# Patient Record
Sex: Female | Born: 1995 | Race: Black or African American | Hispanic: No | Marital: Single
Health system: Southern US, Community
[De-identification: ages and names within clinical notes are randomized; demographics above are authoritative.]

## PROBLEM LIST (undated history)

## (undated) ENCOUNTER — Ambulatory Visit (HOSPITAL_COMMUNITY): Admission: EM | Discharge status: Absent without leave | Payer: Managed Care, Other (non HMO)

## (undated) DIAGNOSIS — B359 Dermatophytosis, unspecified: Secondary | ICD-10-CM

## (undated) DIAGNOSIS — E663 Overweight: Secondary | ICD-10-CM

## (undated) DIAGNOSIS — E669 Obesity, unspecified: Secondary | ICD-10-CM

## (undated) HISTORY — DX: Overweight: E66.3

## (undated) HISTORY — DX: Obesity, unspecified: E66.9

## (undated) HISTORY — DX: Dermatophytosis, unspecified: B35.9

## (undated) HISTORY — PX: WISDOM TOOTH EXTRACTION: SHX21

---
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
## 2016-02-13 ENCOUNTER — Encounter: Payer: Self-pay | Admitting: Family

## 2016-02-13 ENCOUNTER — Ambulatory Visit (INDEPENDENT_AMBULATORY_CARE_PROVIDER_SITE_OTHER): Payer: Managed Care, Other (non HMO) | Admitting: Family

## 2016-02-13 ENCOUNTER — Ambulatory Visit
Admission: RE | Admit: 2016-02-13 | Discharge: 2016-02-13 | Disposition: A | Discharge status: Home / Self Care | Payer: Managed Care, Other (non HMO) | Source: Ambulatory Visit | Attending: Family | Admitting: Family

## 2016-02-13 DIAGNOSIS — S43402A Unspecified sprain of left shoulder joint, initial encounter: Secondary | ICD-10-CM

## 2016-02-13 DIAGNOSIS — S4992XA Unspecified injury of left shoulder and upper arm, initial encounter: Secondary | ICD-10-CM | POA: Diagnosis not present

## 2016-02-13 NOTE — Progress Notes (Signed)
Subjective:     Patient ID: Cheryl Roman, female   DOB: April 29, 1996, 20 y.o.   MRN: [id number]  HPI 20 y.o. Female presents with chief complaint of left shoulder pain. 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 She reports it has an aching pain to her left shoulder, she rates it as a 6 on a scale of 0-10, she has used ice and Ibuprofen which both helped some. She denies any known trauma to the shoulder but states that she always carries a tray at work. Denies fever, fatigue, SOB and change in appetite.    Review of Systems  Constitutional: Negative.  Negative for fever, activity change, appetite change and fatigue.  HENT: Negative.   Respiratory: Negative.  Negative for cough, chest tightness, shortness of breath and wheezing.   Cardiovascular: Negative.  Negative for chest pain.  Gastrointestinal: Negative.   Endocrine: Negative.   Musculoskeletal: Positive for arthralgias.       Left shoulder pain   Skin: Negative.  Negative for color change and rash.  Neurological: Negative.    Past Medical History  Diagnosis Date  . Overweight(278.02)   . Tinea   . Obesity     Social History   Social History  . Marital Status: Single    Spouse Name: N/A  . Number of Children: N/A  . Years of Education: N/A   Occupational History  . Not on file.   Social History Main Topics  . Smoking status: Passive Smoke Exposure - Never Smoker  . Smokeless tobacco: Never Used  . Alcohol Use: No  . Drug Use: No  . Sexual Activity: No   Other Topics Concern  . Not on file   Social History Narrative   Lives with parents and 3 younger siblings. Older step-sister out of the home.    Attends [employer].    Past Surgical History  Procedure Laterality Date  . Wisdom tooth extraction  summer 2012    Family History  Problem Relation Age of Onset  . Diabetes Father   . Hyperlipidemia Father   . Hypertension Father   . Asthma  Paternal Grandmother   . Arthritis Paternal Grandmother   . Heart attack Neg Hx   . Sudden death Neg Hx   . Alcohol abuse Neg Hx   . Birth defects Neg Hx   . Cancer Neg Hx   . COPD Neg Hx   . Depression Neg Hx   . Drug abuse Neg Hx   . Early death Neg Hx   . Hearing loss Neg Hx   . Heart disease Neg Hx   . Kidney disease Neg Hx   . Learning disabilities Neg Hx   . Mental illness Neg Hx   . Mental retardation Neg Hx   . Miscarriages / Stillbirths Neg Hx   . Stroke Neg Hx   . Vision loss Neg Hx   . Varicose Veins Neg Hx   . Diabetes Maternal Grandfather     Allergies  Allergen Reactions  . Penicillins Other (See Comments)    Family reaction  . Tomato Itching    Blotchy redness of skin. Resolves with Benadryl    Current Outpatient Prescriptions on File Prior to Visit  Medication Sig Dispense Refill  . Acetaminophen 500 MG coapsule Take 1 capsule (500 mg total) by mouth every 4 (four) hours as needed for pain. 30 capsule 0  . etonogestrel (NEXPLANON) 68 MG  IMPL implant 1 each by Subdermal route once.    . lisdexamfetamine (VYVANSE) 20 MG capsule Take 1 capsule (20 mg total) by mouth daily with breakfast. 31 capsule 0   No current facility-administered medications on file prior to visit.    There were no vitals taken for this visit.chart     Objective:   Physical Exam  Constitutional: She is active.  Cardiovascular: Normal rate, regular rhythm, normal heart sounds and normal pulses.   Pulmonary/Chest: Effort normal and breath sounds normal. She has no decreased breath sounds. She has no wheezes. She has no rhonchi. She has no rales.  Musculoskeletal: Normal range of motion.  No tenderness to palpation of bilateral shoulders. Normal flexion, extension, adduction and abduction of shoulders. Complains of soreness with movements.   Neurological: She is alert.  Skin: Skin is warm, dry and intact.       Assessment:     Shoulder injury, left, initial encounter - Plan: DG  Shoulder Left  Shoulder sprain, left, initial encounter       Plan:     Xray to left shoulder--> normal xray  - RICE  - Follow up in one week if shoulder is still sore, will refer to ortho.

## 2016-02-13 NOTE — Patient Instructions (Signed)

## 2016-02-16 ENCOUNTER — Telehealth: Payer: Self-pay | Admitting: Family

## 2016-02-16 NOTE — Telephone Encounter (Signed)
Returned call to patient. Mailbox was full. Referral for ortho evaluation done.

## 2016-02-16 NOTE — Telephone Encounter (Signed)
T/C from patient stating shoulder pain is getting worse every day. Please call and let her know what to do next.

## 2016-02-17 ENCOUNTER — Ambulatory Visit (INDEPENDENT_AMBULATORY_CARE_PROVIDER_SITE_OTHER): Payer: Managed Care, Other (non HMO) | Admitting: Family

## 2016-02-17 ENCOUNTER — Encounter: Payer: Self-pay | Admitting: Family

## 2016-02-17 ENCOUNTER — Telehealth: Payer: Self-pay | Admitting: Family

## 2016-02-17 VITALS — Wt 173.0 lb

## 2016-02-17 DIAGNOSIS — M25512 Pain in left shoulder: Secondary | ICD-10-CM

## 2016-02-17 DIAGNOSIS — L259 Unspecified contact dermatitis, unspecified cause: Secondary | ICD-10-CM

## 2016-02-17 MED ORDER — TRIAMCINOLONE ACETONIDE 0.025 % EX OINT: 1.0000 "application " | TOPICAL_OINTMENT | Freq: Two times a day (BID) | CUTANEOUS | Status: DC

## 2016-02-17 NOTE — Telephone Encounter (Signed)
Called and spoke with father. Asked him to have Abbigail call the office because her voice mail box is full. Father stated he would pass along message.

## 2016-02-17 NOTE — Progress Notes (Signed)
Subjective:     Patient ID: Cheryl Roman, female   DOB: 1996-03-16, 20 y.o.   MRN: [id number]  HPI 20 y.o. Female presents for follow up of shoulder pain and a new chief complaint of rash to left shoulder. 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 At that time she did not have any swelling or limitations of movement. An xray was performed that was normal and patient was instructed to rest, elevate elbow, take ibuprofen and use ice to help with pain. Since that time she states that the pain continues, it is a lot better after she uses ice or takes Ibuprofen but then starts hurting again. She has not been back to work. She also states that two mornings ago she woke up and had a rash to her left shoulder, when she woke up this morning the rash had gotten worse. She describes it as dry, scaling and itchy at times. Denies discharge or drainage.    Review of Systems  Constitutional: Negative.  Negative for fever, activity change, appetite change and fatigue.  HENT: Negative.   Respiratory: Negative.  Negative for cough, chest tightness, shortness of breath and wheezing.   Cardiovascular: Negative.  Negative for chest pain.  Gastrointestinal: Negative.   Endocrine: Negative.   Musculoskeletal: Positive for arthralgias.       Left shoulder pain   Skin: Negative.  Positive or rash to Left shoulder and neck.  Neurological: Negative.    Past Medical History  Diagnosis Date  . Overweight(278.02)   . Tinea   . Obesity     Social History   Social History  . Marital Status: Single    Spouse Name: N/A  . Number of Children: N/A  . Years of Education: N/A   Occupational History  . Not on file.   Social History Main Topics  . Smoking status: Passive Smoke Exposure - Never Smoker  . Smokeless tobacco: Never Used  . Alcohol Use: No  . Drug Use: No  . Sexual Activity: No   Other Topics Concern  . Not on file   Social History  Narrative   Lives with parents and 3 younger siblings. Older step-sister out of the home.    Attends [employer].    Past Surgical History  Procedure Laterality Date  . Wisdom tooth extraction  summer 2012    Family History  Problem Relation Age of Onset  . Diabetes Father   . Hyperlipidemia Father   . Hypertension Father   . Asthma Paternal Grandmother   . Arthritis Paternal Grandmother   . Heart attack Neg Hx   . Sudden death Neg Hx   . Alcohol abuse Neg Hx   . Birth defects Neg Hx   . Cancer Neg Hx   . COPD Neg Hx   . Depression Neg Hx   . Drug abuse Neg Hx   . Early death Neg Hx   . Hearing loss Neg Hx   . Heart disease Neg Hx   . Kidney disease Neg Hx   . Learning disabilities Neg Hx   . Mental illness Neg Hx   . Mental retardation Neg Hx   . Miscarriages / Stillbirths Neg Hx   . Stroke Neg Hx   . Vision loss Neg Hx   . Varicose Veins Neg Hx   . Diabetes Maternal Grandfather     Allergies  Allergen Reactions  . Penicillins Other (See  Comments)    Family reaction  . Tomato Itching    Blotchy redness of skin. Resolves with Benadryl    Current Outpatient Prescriptions on File Prior to Visit  Medication Sig Dispense Refill  . Acetaminophen 500 MG coapsule Take 1 capsule (500 mg total) by mouth every 4 (four) hours as needed for pain. 30 capsule 0  . etonogestrel (NEXPLANON) 68 MG IMPL implant 1 each by Subdermal route once.    . lisdexamfetamine (VYVANSE) 20 MG capsule Take 1 capsule (20 mg total) by mouth daily with breakfast. 31 capsule 0   No current facility-administered medications on file prior to visit.    Wt 173 lb (78.472 kg)chart     Objective:   Physical Exam  Constitutional: She is active.  Cardiovascular: Normal rate, regular rhythm, normal heart sounds and normal pulses.   Pulmonary/Chest: Effort normal and breath sounds normal. She has no decreased breath sounds. She has no wheezes. She has no rhonchi. She has no rales.   Musculoskeletal: Normal range of motion.  No tenderness to palpation of bilateral shoulders. Normal flexion, extension, adduction and abduction of shoulders. Complains of soreness with movements.   Neurological: She is alert.  Skin: Skin is warm, dry and intact. She has a scaling rash present to her left shoulder and neck. There is no discharge or erythema present. It is not warm or tender to palpation.       Assessment:     Contact dermatitis Left shoulder pain       Plan:     - Refer to Ortho done. Has appointment on Monday for evaluation  - RICE  - Kenalog cream to rash.  - Follow up if symptoms worsen or fail to improve.

## 2016-02-17 NOTE — Patient Instructions (Signed)
Contact Dermatitis Dermatitis is redness, soreness, and swelling (inflammation) of the skin. Contact dermatitis is a reaction to certain substances that touch the skin. There are two types of contact dermatitis:   Irritant contact dermatitis. This type is caused by something that irritates your skin, such as dry hands from washing them too much. This type does not require previous exposure to the substance for a reaction to occur. This type is more common.  Allergic contact dermatitis. This type is caused by a substance that you are allergic to, such as a nickel allergy or poison ivy. This type only occurs if you have been exposed to the substance (allergen) before. Upon a repeat exposure, your body reacts to the substance. This type is less common. CAUSES  Many different substances can cause contact dermatitis. Irritant contact dermatitis is most commonly caused by exposure to:   Makeup.   Soaps.   Detergents.   Bleaches.   Acids.   Metal salts, such as nickel.  Allergic contact dermatitis is most commonly caused by exposure to:   Poisonous plants.   Chemicals.   Jewelry.   Latex.   Medicines.   Preservatives in products, such as clothing.  RISK FACTORS This condition is more likely to develop in:   People who have jobs that expose them to irritants or allergens.  People who have certain medical conditions, such as asthma or eczema.  SYMPTOMS  Symptoms of this condition may occur anywhere on your body where the irritant has touched you or is touched by you. Symptoms include:  Dryness or flaking.   Redness.   Cracks.   Itching.   Pain or a burning feeling.   Blisters.  Drainage of small amounts of blood or clear fluid from skin cracks. With allergic contact dermatitis, there may also be swelling in areas such as the eyelids, mouth, or genitals.  DIAGNOSIS  This condition is diagnosed with a medical history and physical exam. A patch skin test  may be performed to help determine the cause. If the condition is related to your job, you may need to see an occupational medicine specialist. TREATMENT Treatment for this condition includes figuring out what caused the reaction and protecting your skin from further contact. Treatment may also include:   Steroid creams or ointments. Oral steroid medicines may be needed in more severe cases.  Antibiotics or antibacterial ointments, if a skin infection is present.  Antihistamine lotion or an antihistamine taken by mouth to ease itching.  A bandage (dressing). HOME CARE INSTRUCTIONS Skin Care  Moisturize your skin as needed.   Apply cool compresses to the affected areas.  Try taking a bath with:  Epsom salts. Follow the instructions on the packaging. You can get these at your local pharmacy or grocery store.  Baking soda. Pour a small amount into the bath as directed by your health care provider.  Colloidal oatmeal. Follow the instructions on the packaging. You can get this at your local pharmacy or grocery store.  Try applying baking soda paste to your skin. Stir water into baking soda until it reaches a paste-like consistency.  Do not scratch your skin.  Bathe less frequently, such as every other day.  Bathe in lukewarm water. Avoid using hot water. Medicines  Take or apply over-the-counter and prescription medicines only as told by your health care provider.   If you were prescribed an antibiotic medicine, take or apply your antibiotic as told by your health care provider. Do not stop using the   antibiotic even if your condition starts to improve. General Instructions  Keep all follow-up visits as told by your health care provider. This is important.  Avoid the substance that caused your reaction. If you do not know what caused it, keep a journal to try to track what caused it. Write down:  What you eat.  What cosmetic products you use.  What you drink.  What  you wear in the affected area. This includes jewelry.  If you were given a dressing, take care of it as told by your health care provider. This includes when to change and remove it. SEEK MEDICAL CARE IF:   Your condition does not improve with treatment.  Your condition gets worse.  You have signs of infection such as swelling, tenderness, redness, soreness, or warmth in the affected area.  You have a fever.  You have new symptoms. SEEK IMMEDIATE MEDICAL CARE IF:   You have a severe headache, neck pain, or neck stiffness.  You vomit.  You feel very sleepy.  You notice red streaks coming from the affected area.  Your bone or joint underneath the affected area becomes painful after the skin has healed.  The affected area turns darker.  You have difficulty breathing.   This information is not intended to replace advice given to you by your health care provider. Make sure you discuss any questions you have with your health care provider.   Document Released: 12/14/2000 Document Revised: 09/07/2015 Document Reviewed: 05/04/2015 Elsevier Interactive Patient Education 2016 [employer]  

## 2016-02-17 NOTE — Telephone Encounter (Signed)
Mom called you saw Cheryl Roman this week. Cheryl Roman is still is in a lot of pain and mom would like to talk to you about what they can do for the pain.

## 2016-04-04 ENCOUNTER — Ambulatory Visit (INDEPENDENT_AMBULATORY_CARE_PROVIDER_SITE_OTHER): Payer: Managed Care, Other (non HMO) | Admitting: Pediatrics

## 2016-04-04 DIAGNOSIS — Z111 Encounter for screening for respiratory tuberculosis: Secondary | ICD-10-CM

## 2016-04-04 NOTE — Progress Notes (Signed)
Patient received PPD on left forearm. Cheryl CoupeWheal was formed. No reaction noted. Patient must came in 48-72 hours after PPD is placed to have test read. Lot #: [id number] Expire: 05/31/2016 NDC: [id number]

## 2016-04-04 NOTE — Progress Notes (Signed)
Presented today for placement of TB PPD. Instructed to return in 48 hours for PPD reading.

## 2016-04-04 NOTE — Patient Instructions (Signed)
Return on Friday for TB PPD test reading

## 2016-04-06 ENCOUNTER — Ambulatory Visit (INDEPENDENT_AMBULATORY_CARE_PROVIDER_SITE_OTHER): Payer: Self-pay | Admitting: Pediatrics

## 2016-04-06 DIAGNOSIS — Z111 Encounter for screening for respiratory tuberculosis: Secondary | ICD-10-CM

## 2016-04-06 DIAGNOSIS — Z7689 Persons encountering health services in other specified circumstances: Secondary | ICD-10-CM

## 2016-04-06 LAB — TB SKIN TEST
INDURATION: 0 mm
TB Skin Test: NEGATIVE

## 2016-04-06 NOTE — Progress Notes (Signed)
TB PPD placed on left forearm on 04/04/2016. PPD negative for TB.

## 2016-05-17 ENCOUNTER — Encounter: Payer: Self-pay | Admitting: Family

## 2016-05-17 ENCOUNTER — Ambulatory Visit (INDEPENDENT_AMBULATORY_CARE_PROVIDER_SITE_OTHER): Payer: Managed Care, Other (non HMO) | Admitting: Family

## 2016-05-17 VITALS — Wt 176.5 lb

## 2016-05-17 DIAGNOSIS — J029 Acute pharyngitis, unspecified: Secondary | ICD-10-CM

## 2016-05-17 DIAGNOSIS — J020 Streptococcal pharyngitis: Secondary | ICD-10-CM | POA: Diagnosis not present

## 2016-05-17 DIAGNOSIS — H109 Unspecified conjunctivitis: Secondary | ICD-10-CM | POA: Diagnosis not present

## 2016-05-17 MED ORDER — OFLOXACIN 0.3 % OP SOLN: 2.0000 [drp] | Freq: Four times a day (QID) | OPHTHALMIC | Status: AC

## 2016-05-17 MED ORDER — CEFDINIR 300 MG PO CAPS: 300.0000 mg | ORAL_CAPSULE | Freq: Two times a day (BID) | ORAL | Status: DC

## 2016-05-17 NOTE — Patient Instructions (Signed)
Strep Throat Strep throat is a bacterial infection of the throat. Your health care provider may call the infection tonsillitis or pharyngitis, depending on whether there is swelling in the tonsils or at the back of the throat. Strep throat is most common during the cold months of the year in children who are 5-19 years of age, but it can happen during any season in people of any age. This infection is spread from person to person (contagious) through coughing, sneezing, or close contact. CAUSES Strep throat is caused by the bacteria called Streptococcus pyogenes. RISK FACTORS This condition is more likely to develop in:  People who spend time in crowded places where the infection can spread easily.  People who have close contact with someone who has strep throat. SYMPTOMS Symptoms of this condition include:  Fever or chills.   Redness, swelling, or pain in the tonsils or throat.  Pain or difficulty when swallowing.  White or yellow spots on the tonsils or throat.  Swollen, tender glands in the neck or under the jaw.  Red rash all over the body (rare). DIAGNOSIS This condition is diagnosed by performing a rapid strep test or by taking a swab of your throat (throat culture test). Results from a rapid strep test are usually ready in a few minutes, but throat culture test results are available after one or two days. TREATMENT This condition is treated with antibiotic medicine. HOME CARE INSTRUCTIONS Medicines  Take over-the-counter and prescription medicines only as told by your health care provider.  Take your antibiotic as told by your health care provider. Do not stop taking the antibiotic even if you start to feel better.  Have family members who also have a sore throat or fever tested for strep throat. They may need antibiotics if they have the strep infection. Eating and Drinking  Do not share food, drinking cups, or personal items that could cause the infection to spread to  other people.  If swallowing is difficult, try eating soft foods until your sore throat feels better.  Drink enough fluid to keep your urine clear or pale yellow. General Instructions  Gargle with a salt-water mixture 3-4 times per day or as needed. To make a salt-water mixture, completely dissolve -1 tsp of salt in 1 cup of warm water.  Make sure that all household members wash their hands well.  Get plenty of rest.  Stay home from school or work until you have been taking antibiotics for 24 hours.  Keep all follow-up visits as told by your health care provider. This is important. SEEK MEDICAL CARE IF:  The glands in your neck continue to get bigger.  You develop a rash, cough, or earache.  You cough up a thick liquid that is green, yellow-brown, or bloody.  You have pain or discomfort that does not get better with medicine.  Your problems seem to be getting worse rather than better.  You have a fever. SEEK IMMEDIATE MEDICAL CARE IF:  You have new symptoms, such as vomiting, severe headache, stiff or painful neck, chest pain, or shortness of breath.  You have severe throat pain, drooling, or changes in your voice.  You have swelling of the neck, or the skin on the neck becomes red and tender.  You have signs of dehydration, such as fatigue, dry mouth, and decreased urination.  You become increasingly sleepy, or you cannot wake up completely.  Your joints become red or painful.   This information is not intended to replace   advice given to you by your health care provider. Make sure you discuss any questions you have with your health care provider.   Document Released: 12/14/2000 Document Revised: 09/07/2015 Document Reviewed: 04/11/2015 Elsevier Interactive Patient Education 2016 Elsevier Inc. Bacterial Conjunctivitis Bacterial conjunctivitis, commonly called pink eye, is an inflammation of the clear membrane that covers the white part of the eye (conjunctiva). The  inflammation can also happen on the underside of the eyelids. The blood vessels in the conjunctiva become inflamed, causing the eye to become red or pink. Bacterial conjunctivitis may spread easily from one eye to another and from person to person (contagious).  CAUSES  Bacterial conjunctivitis is caused by bacteria. The bacteria may come from your own skin, your upper respiratory tract, or from someone else with bacterial conjunctivitis. SYMPTOMS  The normally white color of the eye or the underside of the eyelid is usually pink or red. The pink eye is usually associated with irritation, tearing, and some sensitivity to light. Bacterial conjunctivitis is often associated with a thick, yellowish discharge from the eye. The discharge may turn into a crust on the eyelids overnight, which causes your eyelids to stick together. If a discharge is present, there may also be some blurred vision in the affected eye. DIAGNOSIS  Bacterial conjunctivitis is diagnosed by your caregiver through an eye exam and the symptoms that you report. Your caregiver looks for changes in the surface tissues of your eyes, which may point to the specific type of conjunctivitis. A sample of any discharge may be collected on a cotton-tip swab if you have a severe case of conjunctivitis, if your cornea is affected, or if you keep getting repeat infections that do not respond to treatment. The sample will be sent to a lab to see if the inflammation is caused by a bacterial infection and to see if the infection will respond to antibiotic medicines. TREATMENT   Bacterial conjunctivitis is treated with antibiotics. Antibiotic eyedrops are most often used. However, antibiotic ointments are also available. Antibiotics pills are sometimes used. Artificial tears or eye washes may ease discomfort. HOME CARE INSTRUCTIONS   To ease discomfort, apply a cool, clean washcloth to your eye for 10-20 minutes, 3-4 times a day.  Gently wipe away any  drainage from your eye with a warm, wet washcloth or a cotton ball.  Wash your hands often with soap and water. Use paper towels to dry your hands.  Do not share towels or washcloths. This may spread the infection.  Change or wash your pillowcase every day.  You should not use eye makeup until the infection is gone.  Do not operate machinery or drive if your vision is blurred.  Stop using contact lenses. Ask your caregiver how to sterilize or replace your contacts before using them again. This depends on the type of contact lenses that you use.  When applying medicine to the infected eye, do not touch the edge of your eyelid with the eyedrop bottle or ointment tube. SEEK IMMEDIATE MEDICAL CARE IF:   Your infection has not improved within 3 days after beginning treatment.  You had yellow discharge from your eye and it returns.  You have increased eye pain.  Your eye redness is spreading.  Your vision becomes blurred.  You have a fever or persistent symptoms for more than 2-3 days.  You have a fever and your symptoms suddenly get worse.  You have facial pain, redness, or swelling. MAKE SURE YOU:   Understand these instructions.  Will watch your condition.  Will get help right away if you are not doing well or get worse.   This information is not intended to replace advice given to you by your health care provider. Make sure you discuss any questions you have with your health care provider.   Document Released: 12/17/2005 Document Revised: 01/07/2015 Document Reviewed: 05/19/2012 Elsevier Interactive Patient Education [employer] Elsevier Inc.

## 2016-05-17 NOTE — Progress Notes (Signed)
20 y.o. Female presents with complaints of diarrhea, sore throat and red, itchy eye. She states that she started having sore throat and upset stomach 2-3 days ago, she then developed diarrhea. She is drinking well but does not have much of an appetite. She took Tylenol for the sore throat which helped a little. This morning she woke up and her eye lids were sticky and had green discharge on them. Her eyes are now red and itchy.     Review of Systems  Constitutional: Positive for sore throat. Negative for chills, activity change and appetite change.  HENT: Positive for sore throat. Negative for cough, congestion, ear pain, trouble swallowing, voice change, tinnitus and ear discharge.   Eyes: Positive for discharge, redness and itching.  Respiratory:  Negative for cough and wheezing.   Cardiovascular: Negative for chest pain.  Gastrointestinal: Negative for nausea, vomiting and diarrhea.  Musculoskeletal: Negative for arthralgias.  Skin: Negative for rash.  Neurological: Negative for weakness and headaches.  Hematological: Negative for adenopathy.       Objective:   Physical Exam  Constitutional: She appears well-developed and well-nourished.   HENT:  Right Ear: Tympanic membrane normal.  Left Ear: Tympanic membrane normal.  Nose: No nasal discharge.  Mouth/Throat: Mucous membranes are moist. No dental caries. No tonsillar exudate. Pharynx is erythematous with palatal petichea..  Eyes: Pupils are equal, round, and reactive to light. Sclera are red.   Neck: Normal range of motion.  Cardiovascular: Regular rhythm.   No murmur heard. Pulmonary/Chest: Effort normal and breath sounds normal. No nasal flaring. No respiratory distress. No wheezes and  no retraction.  Abdominal: Soft. Bowel sounds are normal. She exhibits no distension. There is no tenderness.  Musculoskeletal: Normal range of motion. No tenderness.  Neurological: She is alert.  Skin: Skin is warm and moist. No rash noted.      Strep test was positive    Assessment:      Strep throat Conjunctivitis     Plan:  Cefdinir BID x 10 days  Ocuflox x 7 days  Tylenol or ibuprofen for pain fever Follow up as needed.

## 2016-07-26 ENCOUNTER — Ambulatory Visit (INDEPENDENT_AMBULATORY_CARE_PROVIDER_SITE_OTHER): Payer: Managed Care, Other (non HMO) | Admitting: Pediatrics

## 2016-07-26 ENCOUNTER — Encounter: Payer: Self-pay | Admitting: Pediatrics

## 2016-07-26 VITALS — Wt 170.9 lb

## 2016-07-26 DIAGNOSIS — A084 Viral intestinal infection, unspecified: Secondary | ICD-10-CM | POA: Diagnosis not present

## 2016-07-26 NOTE — Progress Notes (Signed)
Subjective:     Cheryl Roman is a 20 y.o. female who presents for evaluation of vomiting and diarrhea. Symptoms have been present for 3 days. Patient denies acholic stools, blood in stool, constipation, dysuria, fever, heartburn, hematemesis, hematuria, melena and abdominal pain. Patient's oral intake has been decreased for liquids and decreased for solids. Patient's urine output has been adequate. 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 Patient denies recent travel history. Patient has not had recent ingestion of possible contaminated food, toxic plants, or inappropriate medications/poisons.   The following portions of the patient's history were reviewed and updated as appropriate: allergies, current medications, past family history, past medical history, past social history, past surgical history and problem list.  Review of Systems Pertinent items are noted in HPI.    Objective:     General appearance: alert, cooperative, appears stated age and no distress Head: Normocephalic, without obvious abnormality, atraumatic Eyes: conjunctivae/corneas clear. PERRL, EOM's intact. Fundi benign. Ears: normal TM's and external ear canals both ears Nose: Nares normal. Septum midline. Mucosa normal. No drainage or sinus tenderness. Throat: lips, mucosa, and tongue normal; teeth and gums normal Neck: no adenopathy, no carotid bruit, no JVD, supple, symmetrical, trachea midline and thyroid not enlarged, symmetric, no tenderness/mass/nodules Lungs: clear to auscultation bilaterally Heart: regular rate and rhythm, S1, S2 normal, no murmur, click, rub or gallop Abdomen: abnormal findings:  hyperactive bowel sounds and mild tenderness in the epigastrium    Assessment:    Acute Gastroenteritis    Plan:    1. Discussed oral rehydration, reintroduction of solid foods, signs of dehydration. 2. Return or go to emergency department if worsening symptoms, blood or bile,  signs of dehydration, diarrhea lasting longer than 5 days or any new concerns. 3. Follow up as needed.

## 2016-07-26 NOTE — Patient Instructions (Signed)
Increase fluid intake Daily probiotic  Viral Gastroenteritis Viral gastroenteritis is also known as stomach flu. This condition affects the stomach and intestinal tract. It can cause sudden diarrhea and vomiting. The illness typically lasts 3 to 8 days. Most people develop an immune response that eventually gets rid of the virus. While this natural response develops, the virus can make you quite ill. CAUSES  Many different viruses can cause gastroenteritis, such as rotavirus or noroviruses. You can catch one of these viruses by consuming contaminated food or water. You may also catch a virus by sharing utensils or other personal items with an infected person or by touching a contaminated surface. SYMPTOMS  The most common symptoms are diarrhea and vomiting. These problems can cause a severe loss of body fluids (dehydration) and a body salt (electrolyte) imbalance. Other symptoms may include:  Fever.  Headache.  Fatigue.  Abdominal pain. DIAGNOSIS  Your caregiver can usually diagnose viral gastroenteritis based on your symptoms and a physical exam. A stool sample may also be taken to test for the presence of viruses or other infections. TREATMENT  This illness typically goes away on its own. Treatments are aimed at rehydration. The most serious cases of viral gastroenteritis involve vomiting so severely that you are not able to keep fluids down. In these cases, fluids must be given through an intravenous line (IV). HOME CARE INSTRUCTIONS   Drink enough fluids to keep your urine clear or pale yellow. Drink small amounts of fluids frequently and increase the amounts as tolerated.  Ask your caregiver for specific rehydration instructions.  Avoid:  Foods high in sugar.  Alcohol.  Carbonated drinks.  Tobacco.  Juice.  Caffeine drinks.  Extremely hot or cold fluids.  Fatty, greasy foods.  Too much intake of anything at one time.  Dairy products until 24 to 48 hours after  diarrhea stops.  You may consume probiotics. Probiotics are active cultures of beneficial bacteria. They may lessen the amount and number of diarrheal stools in adults. Probiotics can be found in yogurt with active cultures and in supplements.  Wash your hands well to avoid spreading the virus.  Only take over-the-counter or prescription medicines for pain, discomfort, or fever as directed by your caregiver. Do not give aspirin to children. Antidiarrheal medicines are not recommended.  Ask your caregiver if you should continue to take your regular prescribed and over-the-counter medicines.  Keep all follow-up appointments as directed by your caregiver. SEEK IMMEDIATE MEDICAL CARE IF:   You are unable to keep fluids down.  You do not urinate at least once every 6 to 8 hours.  You develop shortness of breath.  You notice blood in your stool or vomit. This may look like coffee grounds.  You have abdominal pain that increases or is concentrated in one small area (localized).  You have persistent vomiting or diarrhea.  You have a fever.  The patient is a child younger than 3 months, and he or she has a fever.  The patient is a child older than 3 months, and he or she has a fever and persistent symptoms.  The patient is a child older than 3 months, and he or she has a fever and symptoms suddenly get worse.  The patient is a baby, and he or she has no tears when crying. MAKE SURE YOU:   Understand these instructions.  Will watch your condition.  Will get help right away if you are not doing well or get worse.   This  information is not intended to replace advice given to you by your health care provider. Make sure you discuss any questions you have with your health care provider.   Document Released: 12/17/2005 Document Revised: 03/10/2012 Document Reviewed: 10/03/2011 Elsevier Interactive Patient Education [employer].

## 2016-08-02 ENCOUNTER — Ambulatory Visit (INDEPENDENT_AMBULATORY_CARE_PROVIDER_SITE_OTHER): Payer: Managed Care, Other (non HMO) | Admitting: Pediatrics

## 2016-08-02 VITALS — Wt 170.6 lb

## 2016-08-02 DIAGNOSIS — M25512 Pain in left shoulder: Secondary | ICD-10-CM

## 2016-08-03 ENCOUNTER — Encounter: Payer: Self-pay | Admitting: Pediatrics

## 2016-08-03 DIAGNOSIS — M25512 Pain in left shoulder: Secondary | ICD-10-CM | POA: Insufficient documentation

## 2016-08-03 NOTE — Patient Instructions (Signed)

## 2016-08-03 NOTE — Progress Notes (Signed)
Subjective:    Cheryl Roman is a 20 y.o. female who presents with left shoulder pain. The symptoms began several weeks ago. 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 Pain is located between the neck and shoulder. Discomfort is described as aching. Symptoms are exacerbated by repetitive movements. Evaluation to date: plain films: normal. Therapy to date includes: nothing specific.  The following portions of the patient's history were reviewed and updated as appropriate: allergies, current medications, past family history, past medical history, past social history, past surgical history and problem list.  Review of Systems Pertinent items are noted in HPI.   Objective:    Wt 170 lb 9.6 oz (77.4 kg)   BMI 35.96 kg/m  Right shoulder: normal active ROM, no tenderness, no impingement sign  Left shoulder: non-specific diffuse tenderness about the shoulder     Assessment:    Left shoulder pain    Plan:    Reduction in offending activity. Gentle ROM exercises. Rest, ice, compression, and elevation (RICE) therapy. NSAIDs per medication orders. Orthopedics referral. ---urgent in view of persistence of symptoms over the past few weeks--has an appointment with orthopedics at 4 pm today and patient has agreed to go there right away. Left office in good condition to go over to orthopedics office.

## 2017-04-05 ENCOUNTER — Ambulatory Visit (INDEPENDENT_AMBULATORY_CARE_PROVIDER_SITE_OTHER): Payer: Managed Care, Other (non HMO) | Admitting: Pediatrics

## 2017-04-05 VITALS — Temp 97.4°F | Wt 178.3 lb

## 2017-04-05 DIAGNOSIS — J019 Acute sinusitis, unspecified: Secondary | ICD-10-CM | POA: Diagnosis not present

## 2017-04-05 MED ORDER — CEFDINIR 250 MG/5ML PO SUSR: 300.0000 mg | Freq: Two times a day (BID) | ORAL | 0 refills | Status: AC

## 2017-04-05 NOTE — Patient Instructions (Signed)

## 2017-04-05 NOTE — Progress Notes (Signed)
Subjective:    Cheryl Roman is a 21 y.o. old female here with her self for Headache; Cough; and Nasal Congestion .    HPI: Cheryl Roman presents with history of sore throat for 2 weeks.  Runny nose and congestion and cough for about 1 week.  She has had a HA on and off for 2 weeks.  She has been now blowing out green nasal d/c, was thin and clear when symptoms started.  She did have a fever the first few weeks initially but has not had any since.  Appetite has been limited but has been trying to drink fluids lately.  Cough did start more at night and now both day and night.      Review of Systems Pertinent items are noted in HPI.   Allergies: Allergies  Allergen Reactions  . Penicillins Other (See Comments)    Family reaction  . Tomato Itching    Blotchy redness of skin. Resolves with Benadryl     Current Outpatient Prescriptions on File Prior to Visit  Medication Sig Dispense Refill  . Acetaminophen 500 MG coapsule Take 1 capsule (500 mg total) by mouth every 4 (four) hours as needed for pain. 30 capsule 0  . etonogestrel (NEXPLANON) 68 MG IMPL implant 1 each by Subdermal route once.    . lisdexamfetamine (VYVANSE) 20 MG capsule Take 1 capsule (20 mg total) by mouth daily with breakfast. 31 capsule 0  . triamcinolone (KENALOG) 0.025 % ointment Apply 1 application topically 2 (two) times daily. 30 g 0   No current facility-administered medications on file prior to visit.     History and Problem List: Past Medical History:  Diagnosis Date  . Obesity   . Overweight(278.02)   . Tinea     Patient Active Problem List   Diagnosis Date Noted  . Acute non-recurrent sinusitis 04/05/2017  . Left shoulder pain 08/03/2016  . Family history of hyperlipidemia 09/29/2012  . Obesity (BMI 30.0-34.9) 09/29/2012        Objective:    Temp 97.4 F (36.3 C) (Temporal)   Wt 178 lb 4.8 oz (80.9 kg)   BMI 37.59 kg/m   General: alert, active, cooperative, non toxic ENT: oropharynx moist, no  lesions, nares thick discharge, nasal mucosa inflammation Eye:  PERRL, EOMI, conjunctivae clear, no discharge Ears: TM clear/intact bilateral, no discharge Neck: supple, no sig LAD Lungs: clear to auscultation, no wheeze, crackles or retractions Heart: RRR, Nl S1, S2, no murmurs Abd: soft, non tender, non distended, normal BS, no organomegaly, no masses appreciated Skin: no rashes Neuro: normal mental status, No focal deficits  No results found for this or any previous visit (from the past 2160 hour(s)).     Assessment:   Cheryl Roman is a 21 y.o. old female with  1. Acute non-recurrent sinusitis, unspecified location     Plan:   1.  Treat for presumed sinusitis with worsening symptoms and no improvement now 2 weeks.  History of reported penicillin allergy but has taking cefdinir with no issues in past.  Start Cefdinir x10 days.  Discuss supportive care and symptomatic relief.  Encourage fluids and rest.   2.  Discussed to return for worsening symptoms or further concerns.    Patient's Medications  New Prescriptions   CEFDINIR (OMNICEF) 250 MG/5ML SUSPENSION    Take 6 mLs (300 mg total) by mouth 2 (two) times daily.  Previous Medications   ACETAMINOPHEN 500 MG COAPSULE    Take 1 capsule (500 mg total) by mouth every  4 (four) hours as needed for pain.   ETONOGESTREL (NEXPLANON) 68 MG IMPL IMPLANT    1 each by Subdermal route once.   LISDEXAMFETAMINE (VYVANSE) 20 MG CAPSULE    Take 1 capsule (20 mg total) by mouth daily with breakfast.   TRIAMCINOLONE (KENALOG) 0.025 % OINTMENT    Apply 1 application topically 2 (two) times daily.  Modified Medications   No medications on file  Discontinued Medications   CEFDINIR (OMNICEF) 300 MG CAPSULE    Take 1 capsule (300 mg total) by mouth 2 (two) times daily.     Return if symptoms worsen or fail to improve. in 2-3 days  Cheryl Gip, DO

## 2017-04-12 ENCOUNTER — Ambulatory Visit (INDEPENDENT_AMBULATORY_CARE_PROVIDER_SITE_OTHER): Payer: Managed Care, Other (non HMO) | Admitting: Pediatrics

## 2017-04-12 ENCOUNTER — Telehealth: Payer: Self-pay | Admitting: Pediatrics

## 2017-04-12 VITALS — Temp 97.4°F

## 2017-04-12 DIAGNOSIS — J039 Acute tonsillitis, unspecified: Secondary | ICD-10-CM

## 2017-04-12 DIAGNOSIS — J029 Acute pharyngitis, unspecified: Secondary | ICD-10-CM | POA: Diagnosis not present

## 2017-04-12 LAB — POCT RAPID STREP A (OFFICE): Rapid Strep A Screen: NEGATIVE

## 2017-04-12 MED ORDER — AZITHROMYCIN 200 MG/5ML PO SUSR: ORAL | 0 refills | Status: AC

## 2017-04-12 MED ORDER — DEXAMETHASONE SODIUM PHOSPHATE 10 MG/ML IJ SOLN
10.0000 mg | Freq: Once | INTRAMUSCULAR | Status: AC
  Administered 2017-04-12: 10 mg via INTRAMUSCULAR

## 2017-04-12 MED ORDER — CEFTRIAXONE SODIUM 500 MG IJ SOLR
500.0000 mg | Freq: Once | INTRAMUSCULAR | Status: AC
  Administered 2017-04-12: 500 mg via INTRAMUSCULAR

## 2017-04-12 MED ORDER — HYDROXYZINE HCL 10 MG/5ML PO SOLN: 20.0000 mg | Freq: Two times a day (BID) | ORAL | 1 refills | Status: AC

## 2017-04-12 NOTE — Telephone Encounter (Signed)
Spoke to patient---still not feeling well and hard to swallow--will see her for sick visit ASAP

## 2017-04-12 NOTE — Progress Notes (Signed)
Ceftriaxone 500 mg given in right thigh Lot # [id number] M Exp: 07/01/2019 NDC: 0454-0981-19  Dexamethasone 10 mg/ml  Lot # [id number]  Exp: 04/29/2018 NDC: 5621-3086-57

## 2017-04-13 ENCOUNTER — Encounter: Payer: Self-pay | Admitting: Pediatrics

## 2017-04-13 DIAGNOSIS — J029 Acute pharyngitis, unspecified: Secondary | ICD-10-CM | POA: Insufficient documentation

## 2017-04-13 NOTE — Patient Instructions (Signed)
Tonsillitis Tonsillitis is an infection of the throat. This infection causes the tonsils to become red, tender, and swollen. Tonsils are tissues in the back of your throat. If bacteria caused your infection, antibiotic medicine will be given to you. Sometimes, symptoms of this infection can be helped with the use of steroid medicine. If your tonsillitis is very bad (severe) and happens often, you may need to get your tonsils removed (tonsillectomy). Follow these instructions at home: Medicines  Take over-the-counter and prescription medicines only as told by your doctor.  If you were prescribed an antibiotic, take it as told by your doctor. Do not stop taking the antibiotic even if you start to feel better. Eating and drinking  Drink enough fluid to keep your pee (urine) clear or pale yellow.  While your throat is sore, eat soft or liquid foods like: ? Soup. ? Sherbert. ? Instant breakfast drinks.  Drink warm fluids.  Eat frozen ice pops. General instructions  Rest as much as possible and get plenty of sleep.  Gargle with a salt-water mixture 3-4 times a day or as needed. To make a salt-water mixture, completely dissolve -1 tsp of salt in 1 cup of warm water.  Wash your hands often with soap and water. If there is no soap and water, use hand sanitizer.  Do not share cups, bottles, or other utensils until your symptoms are gone.  Do not smoke. If you need help quitting, ask your doctor.  Keep all follow-up visits as told by your doctor. This is important. Contact a doctor if:  You have large, tender lumps in your neck.  You have a fever that does not go away after 2-3 days.  You have a rash.  You cough up green, yellow-brown, or bloody fluid.  You cannot swallow liquids or food for 24 hours.  Only one of your tonsils is swollen. Get help right away if:  You have any new symptoms such as: ? Vomiting ? Very bad headache ? Stiff neck ? Chest pain ? Trouble breathing  or swallowing  You have very bad throat pain and also have drooling or voice changes.  You have very bad pain that is not helped by medicine.  You cannot fully open your mouth.  You have redness, swelling, or severe pain anywhere in your neck. Summary  Tonsillitis causes your tonsils to be red, tender, and swollen.  While your throat is sore eat soft or liquid foods.  Gargle with a salt-water mixture 3-4 times a day or as needed.  Do not share cups, bottles, or other utensils until your symptoms are gone. This information is not intended to replace advice given to you by your health care provider. Make sure you discuss any questions you have with your health care provider. Document Released: 06/04/2008 Document Revised: 05/24/2016 Document Reviewed: 06/05/2013 Elsevier Interactive Patient Education  2017 Elsevier Inc.  

## 2017-04-13 NOTE — Progress Notes (Signed)
Subjective:     Cheryl Roman is a 21 y.o. female who presents for evaluation of sore throat. Associated symptoms include low grade fevers, chest congestion, enlarged tonsils, hoarseness, nasal blockage, post nasal drip, sinus and nasal congestion, sore throat and swollen glands. Onset of symptoms was 7 days ago, and have been gradually worsening despite use of antibiotics. She is drinking plenty of fluids. She has not had a recent close exposure to someone with proven streptococcal pharyngitis.  The following portions of the patient's history were reviewed and updated as appropriate: allergies, current medications, past family history, past medical history, past social history, past surgical history and problem list.  Review of Systems Pertinent items are noted in HPI.    Objective:    Temp 97.4 F (36.3 C) (Temporal)  General:  alert, cooperative and no distress  Mouth:  lips, mucosa, and tongue normal; teeth and gums normal  Neck: no adenopathy and supple, symmetrical, trachea midline.  Throat--red and swollen tonsils bilaterally CVS--no murmurs Abdomen --normal Skin --normal CNS--alert and oriented  Laboratory Strep test done. Results:negative    Assessment:     Acute Pharyngitis, likely  Bacterial tonsillitis R/O tonsillopharyngitis    Plan:    Patient placed on antibiotics. Use of OTC analgesics recommended as well as salt water gargles. Use of decongestant recommended. Patient advised of the risk of peritonsillar abscess formation. Patient advised that he will be infectious for 24 hours after starting antibiotics. Follow up in 1 day. rocephin IM X 1---decadron IM X 1 --changed to zithromax and discontinue omnicef

## 2017-04-14 LAB — CULTURE, GROUP A STREP

## 2017-04-23 ENCOUNTER — Telehealth: Payer: Self-pay | Admitting: Pediatrics

## 2017-04-23 DIAGNOSIS — J029 Acute pharyngitis, unspecified: Secondary | ICD-10-CM

## 2017-04-23 NOTE — Telephone Encounter (Signed)
Still having pain in throat despite two courses of antibiotics---will refer to ENT for further management---persistent sore throat

## 2017-04-23 NOTE — Telephone Encounter (Signed)
Cheryl Roman called and stated she was seen last week in the office with a sore throat. She states it still hurts and would like Dr Barney Drain to call her please

## 2017-05-09 ENCOUNTER — Telehealth: Payer: Self-pay | Admitting: Pediatrics

## 2017-05-09 NOTE — Telephone Encounter (Signed)
Cheryl Roman would like Dr Barney Drainamgoolam to give her a call. SHe thinks she has food poisoning. [name] is [name]'s patient

## 2017-05-14 NOTE — Telephone Encounter (Signed)
Advised her to go to ER for work up of difficulty breathing

## 2017-11-11 ENCOUNTER — Ambulatory Visit: Payer: Managed Care, Other (non HMO)

## 2017-12-18 ENCOUNTER — Ambulatory Visit: Payer: Managed Care, Other (non HMO)

## 2017-12-19 ENCOUNTER — Ambulatory Visit
Admission: RE | Admit: 2017-12-19 | Discharge: 2017-12-19 | Disposition: A | Discharge status: Home / Self Care | Payer: Managed Care, Other (non HMO) | Source: Ambulatory Visit | Attending: Pediatrics | Admitting: Pediatrics

## 2017-12-19 ENCOUNTER — Ambulatory Visit (INDEPENDENT_AMBULATORY_CARE_PROVIDER_SITE_OTHER): Payer: Managed Care, Other (non HMO) | Admitting: Pediatrics

## 2017-12-19 ENCOUNTER — Telehealth: Payer: Self-pay

## 2017-12-19 ENCOUNTER — Encounter: Payer: Self-pay | Admitting: Pediatrics

## 2017-12-19 VITALS — Temp 98.2°F | Wt 187.2 lb

## 2017-12-19 DIAGNOSIS — R059 Cough, unspecified: Secondary | ICD-10-CM

## 2017-12-19 DIAGNOSIS — R05 Cough: Secondary | ICD-10-CM

## 2017-12-19 DIAGNOSIS — J40 Bronchitis, not specified as acute or chronic: Secondary | ICD-10-CM

## 2017-12-19 MED ORDER — PREDNISONE 20 MG PO TABS: 20.0000 mg | ORAL_TABLET | Freq: Two times a day (BID) | ORAL | 0 refills | Status: AC

## 2017-12-19 MED ORDER — ALBUTEROL SULFATE HFA 108 (90 BASE) MCG/ACT IN AERS: 1.0000 | INHALATION_SPRAY | Freq: Four times a day (QID) | RESPIRATORY_TRACT | 2 refills | Status: DC | PRN

## 2017-12-19 NOTE — Patient Instructions (Addendum)
Chest xray at [facility] [address] 20mg  Prednisone, take 2 times a day for 5 days. Take with meals to help prevent stomach upset Over the counter medications- Mucinex cough and congestion or similar products Drink plenty of water Follow up as needed

## 2017-12-19 NOTE — Progress Notes (Signed)
Subjective:     Cheryl Roman is a 21 y.o. female who presents for evaluation of cough. She reports that she was "sick a couple of weeks ago" and the cough never resolved. Over the past few days, she has had trouble breathing and chest pain with the cough. She denies any fevers. Treatment to date: none.  The following portions of the patient's history were reviewed and updated as appropriate: allergies, current medications, past family history, past medical history, past social history, past surgical history and problem list.  Review of Systems Pertinent items are noted in HPI.   Objective:    Temp 98.2 F (36.8 C) (Temporal)   Wt 187 lb 3.2 oz (84.9 kg)   BMI 39.46 kg/m  General appearance: alert, cooperative, appears stated age and no distress Head: Normocephalic, without obvious abnormality, atraumatic Eyes: conjunctivae/corneas clear. PERRL, EOM's intact. Fundi benign. Ears: normal TM's and external ear canals both ears Nose: Nares normal. Septum midline. Mucosa normal. No drainage or sinus tenderness., moderate congestion Throat: lips, mucosa, and tongue normal; teeth and gums normal Neck: no adenopathy, no carotid bruit, no JVD, supple, symmetrical, trachea midline and thyroid not enlarged, symmetric, no tenderness/mass/nodules Lungs: clear to auscultation bilaterally Heart: regular rate and rhythm, S1, S2 normal, no murmur, click, rub or gallop   Assessment:    bronchitis   Plan:    Discussed diagnosis and treatment of URI. Suggested symptomatic OTC remedies. Nasal saline spray for congestion. Prednisone and albuterol inhaler per orders. Follow up as needed.   Chest xray negative for PNA, positive for "bronchitic changes"

## 2017-12-19 NOTE — Telephone Encounter (Signed)
Patient called for xray results. I advised her of results per Dr. Ardyth Manam and told her to do a inhaler 3 times a day and take the prednisolone Calla KicksLynn Klett sent into the pharmacy. She said she does not have a inhaler at home. I advised her Dr. Ardyth Manam would send one into the pharmacy. She gets off work at 6 and will stop at pharmacy on the way home.

## 2018-01-02 NOTE — Telephone Encounter (Signed)
Medication called in 

## 2018-01-16 ENCOUNTER — Encounter: Payer: Self-pay | Admitting: Pediatrics

## 2018-06-12 IMAGING — CR DG CHEST 2V
2 series · 2 of 2 positions shown · non-contrast
Comparison: None.

CLINICAL DATA: Productive cough for 4 weeks

EXAM:
CHEST  2 VIEW

[w chest pa]
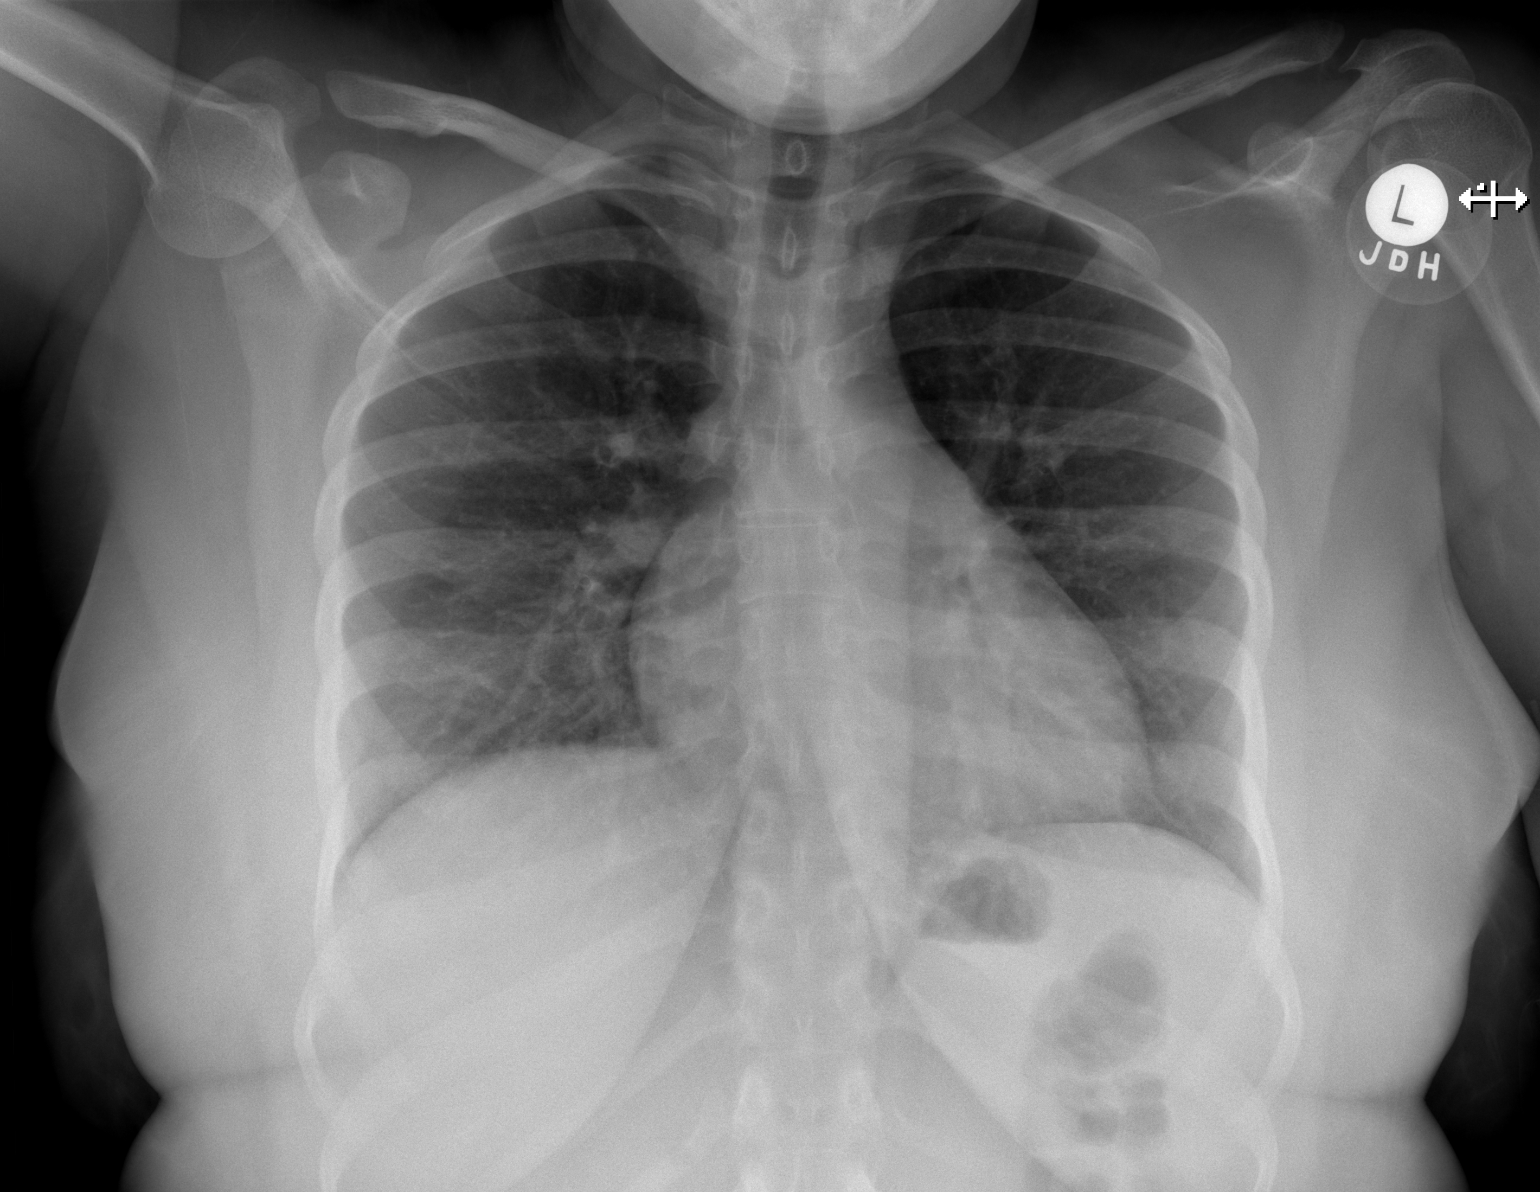

[w chest lat]
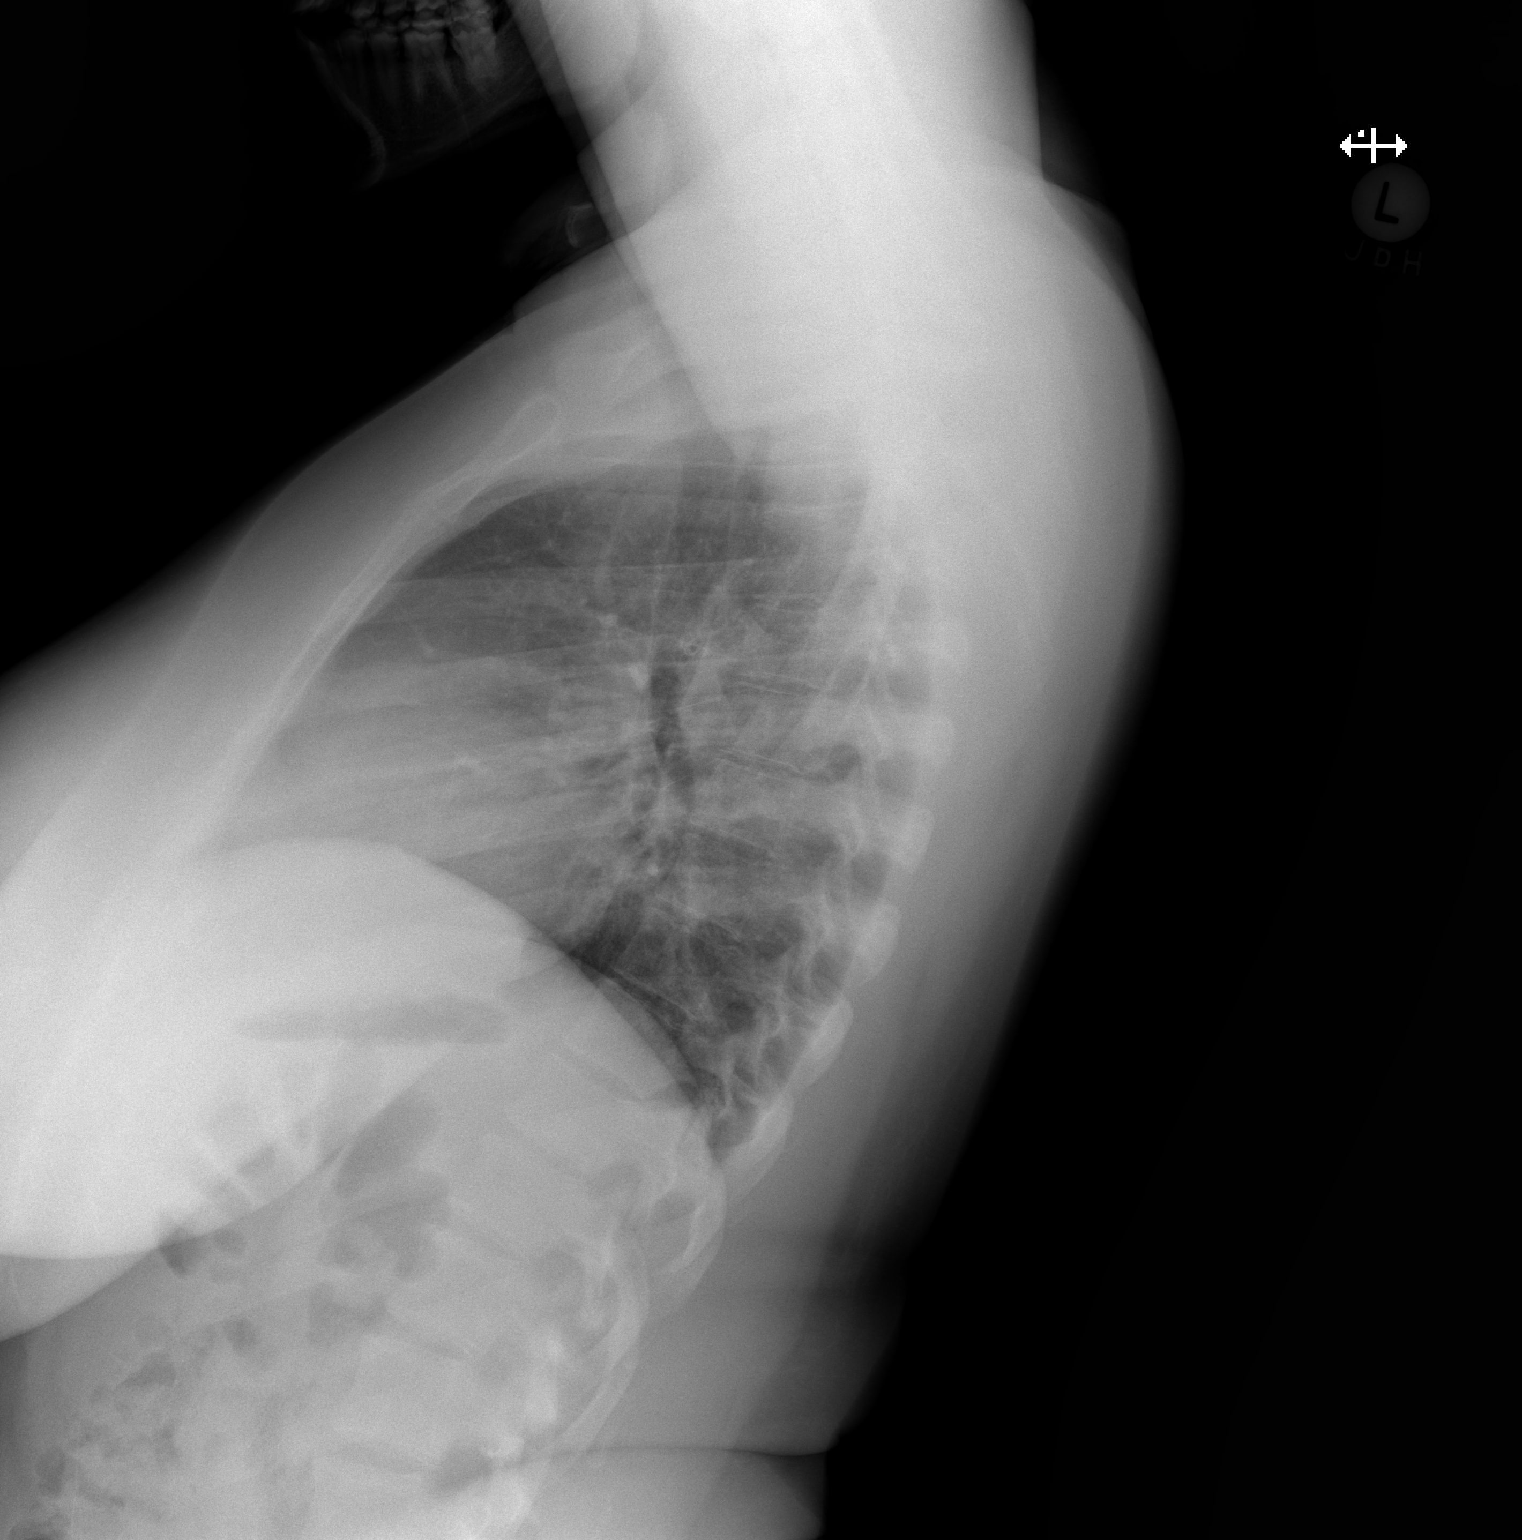

[2 of 2 positions shown; findings below may reference images not displayed]

FINDINGS: Mild peribronchial thickening. Heart and mediastinal contours are
within normal limits. No focal opacities or effusions. No acute bony
abnormality.
IMPRESSION: Mild bronchitic changes.

## 2018-08-06 ENCOUNTER — Ambulatory Visit: Payer: Managed Care, Other (non HMO) | Admitting: Allergy and Immunology

## 2020-03-24 ENCOUNTER — Ambulatory Visit: Payer: Self-pay | Attending: Internal Medicine

## 2020-03-24 DIAGNOSIS — Z23 Encounter for immunization: Secondary | ICD-10-CM

## 2020-03-24 NOTE — Progress Notes (Signed)
   Covid-19 Vaccination Clinic  Name:  Cheryl Roman    MRN: [id number] DOB: [birth year]  03/24/2020  [name] was observed post Covid-19 immunization for 15 minutes without incident. She was provided with Vaccine Information Sheet and instruction to access the V-Safe system.   [name] was instructed to call 911 with any severe reactions post vaccine: Marland Kitchen Difficulty breathing  . Swelling of face and throat  . A fast heartbeat  . A bad rash all over body  . Dizziness and weakness   Immunizations Administered    Name Date Dose VIS Date Route   Pfizer COVID-19 Vaccine 03/24/2020  8:42 AM 0.3 mL 12/11/2019 Intramuscular   Manufacturer: [employer], [employer]   Lot: [id number]   NDC: [address]-9

## 2020-04-18 ENCOUNTER — Ambulatory Visit: Payer: Self-pay | Attending: Internal Medicine

## 2020-04-18 DIAGNOSIS — Z23 Encounter for immunization: Secondary | ICD-10-CM

## 2020-04-18 NOTE — Progress Notes (Signed)
   Covid-19 Vaccination Clinic  Name:  Cheryl Roman    MRN: [id number] DOB: 1996-03-07  04/18/2020  Cheryl Roman was observed post Covid-19 immunization for 30 minutes based on pre-vaccination screening without incident. She was provided with Vaccine Information Sheet and instruction to access the V-Safe system.   Cheryl Roman was instructed to call 911 with any severe reactions post vaccine: Marland Kitchen Difficulty breathing  . Swelling of face and throat  . A fast heartbeat  . A bad rash all over body  . Dizziness and weakness   Immunizations Administered    Name Date Dose VIS Date Route   Pfizer COVID-19 Vaccine 04/18/2020  8:47 AM 0.3 mL 02/24/2019 Intramuscular   Manufacturer: [employer], [employer]   Lot: [id number]   NDC: [address]-3

## 2022-01-07 ENCOUNTER — Encounter (HOSPITAL_COMMUNITY): Payer: Self-pay | Admitting: *Deleted

## 2022-01-07 ENCOUNTER — Other Ambulatory Visit: Payer: Self-pay

## 2022-01-07 ENCOUNTER — Ambulatory Visit (HOSPITAL_COMMUNITY)
Admission: EM | Admit: 2022-01-07 | Discharge: 2022-01-07 | Disposition: A | Discharge status: Home / Self Care | Payer: No Typology Code available for payment source | Attending: Internal Medicine | Admitting: Internal Medicine

## 2022-01-07 DIAGNOSIS — J069 Acute upper respiratory infection, unspecified: Secondary | ICD-10-CM

## 2022-01-07 LAB — POC INFLUENZA A AND B ANTIGEN (URGENT CARE ONLY)
INFLUENZA A ANTIGEN, POC: NEGATIVE
INFLUENZA B ANTIGEN, POC: NEGATIVE

## 2022-01-07 MED ORDER — PROMETHAZINE-DM 6.25-15 MG/5ML PO SYRP: 5.0000 mL | ORAL_SOLUTION | Freq: Four times a day (QID) | ORAL | 0 refills | Status: DC | PRN

## 2022-01-07 NOTE — Discharge Instructions (Addendum)
Take cough syrup as needed Recommend daily Zyrtec or Claritin, flonase, and mucinex Drink plenty of fluids, rest Return if symptoms become worse

## 2022-01-07 NOTE — ED Triage Notes (Signed)
PT request a flu test

## 2022-01-07 NOTE — ED Triage Notes (Signed)
Pt has had Sx's since Tuesday. A cough,sore throat,fever muscle pain

## 2022-01-07 NOTE — ED Provider Notes (Signed)
[facility]    CSN: [id number] Arrival date & time: 01/07/22  1720      History   Chief Complaint Chief Complaint  Patient presents with   Cough   Muscle Pain   Sore Throat   Fever    HPI Cheryl Roman is a 26 y.o. female.   Pt complains of cough, sore throat, congestion, and body aches that started 5 days ago.  Reports intermittent fevers.  She has been taking dayquil and nyquil with temporary relief.  She tried Mucinex today with some improvement.  She denies shortness of breath, n/v/d.  She is a Engineer, site.   Past Medical History:  Diagnosis Date   Obesity    Overweight(278.02)    Tinea     Patient Active Problem List   Diagnosis Date Noted   Bronchitis 12/19/2017   Sore throat 04/13/2017   Acute non-recurrent sinusitis 04/05/2017   Left shoulder pain 08/03/2016   Family history of hyperlipidemia 09/29/2012   Obesity (BMI 30.0-34.9) 09/29/2012   Tonsillopharyngitis 03/10/2012    Past Surgical History:  Procedure Laterality Date   WISDOM TOOTH EXTRACTION  summer 2012    OB History   No obstetric history on file.      Home Medications    Prior to Admission medications   Medication Sig Start Date End Date Taking? Authorizing Provider  promethazine-dextromethorphan (PROMETHAZINE-DM) 6.25-15 MG/5ML syrup Take 5 mLs by mouth 4 (four) times daily as needed for cough. 01/07/22  Yes Ward, Tylene Fantasia, PA-C  Acetaminophen 500 MG coapsule Take 1 capsule (500 mg total) by mouth every 4 (four) hours as needed for pain. 12/14/13   Faylene Kurtz, MD  albuterol (PROVENTIL HFA;VENTOLIN HFA) 108 (90 Base) MCG/ACT inhaler Inhale 1-2 puffs into the lungs every 6 (six) hours as needed for wheezing or shortness of breath. 12/19/17   Estelle June, NP  etonogestrel (NEXPLANON) 68 MG IMPL implant 1 each by Subdermal route once.    [provider]  lisdexamfetamine (VYVANSE) 20 MG capsule Take 1 capsule (20 mg total) by mouth daily with breakfast.  10/19/15 11/19/15  Klett, Pascal Lux, NP  triamcinolone (KENALOG) 0.025 % ointment Apply 1 application topically 2 (two) times daily. 02/17/16   Gretchen Short, NP    Family History Family History  Problem Relation Age of Onset   Diabetes Father    Hyperlipidemia Father    Hypertension Father    Asthma Paternal Grandmother    Arthritis Paternal Grandmother    Diabetes Maternal Grandfather    Heart attack Neg Hx    Sudden death Neg Hx    Alcohol abuse Neg Hx    Birth defects Neg Hx    Cancer Neg Hx    COPD Neg Hx    Depression Neg Hx    Drug abuse Neg Hx    Early death Neg Hx    Hearing loss Neg Hx    Heart disease Neg Hx    Kidney disease Neg Hx    Learning disabilities Neg Hx    Mental illness Neg Hx    Mental retardation Neg Hx    Miscarriages / Stillbirths Neg Hx    Stroke Neg Hx    Vision loss Neg Hx    Varicose Veins Neg Hx     Social History Social History   Tobacco Use   Smoking status: Passive Smoke Exposure - Never Smoker   Smokeless tobacco: Never  Substance Use Topics   Alcohol use: No  Drug use: No     Allergies   Penicillins and Tomato   Review of Systems Review of Systems  Constitutional:  Positive for chills and fever.  HENT:  Positive for congestion. Negative for ear pain and sore throat.   Eyes:  Negative for pain and visual disturbance.  Respiratory:  Positive for cough. Negative for shortness of breath.   Cardiovascular:  Negative for chest pain and palpitations.  Gastrointestinal:  Negative for abdominal pain and vomiting.  Genitourinary:  Negative for dysuria and hematuria.  Musculoskeletal:  Negative for arthralgias and back pain.  Skin:  Negative for color change and rash.  Neurological:  Negative for seizures and syncope.  All other systems reviewed and are negative.   Physical Exam Triage Vital Signs ED Triage Vitals  Enc Vitals Group     BP 01/07/22 1735 127/70     Pulse Rate 01/07/22 1735 97     Resp 01/07/22 1735 18      Temp 01/07/22 1735 99 F (37.2 C)     Temp src --      SpO2 01/07/22 1735 100 %     Weight --      Height --      Head Circumference --      Peak Flow --      Pain Score 01/07/22 1732 6     Pain Loc --      Pain Edu? --      Excl. in [place]? --    No data found.  Updated Vital Signs BP 127/70    Pulse 97    Temp 99 F (37.2 C)    Resp 18    SpO2 100%   Visual Acuity Right Eye Distance:   Left Eye Distance:   Bilateral Distance:    Right Eye Near:   Left Eye Near:    Bilateral Near:     Physical Exam Vitals and nursing note reviewed.  Constitutional:      General: She is not in acute distress.    Appearance: She is well-developed.  HENT:     Head: Normocephalic and atraumatic.     Nose: Congestion present.  Eyes:     Conjunctiva/sclera: Conjunctivae normal.  Cardiovascular:     Rate and Rhythm: Normal rate and regular rhythm.     Heart sounds: No murmur heard. Pulmonary:     Effort: Pulmonary effort is normal. No respiratory distress.     Breath sounds: Normal breath sounds.  Abdominal:     Palpations: Abdomen is soft.     Tenderness: There is no abdominal tenderness.  Musculoskeletal:        General: No swelling.     Cervical back: Neck supple.  Skin:    General: Skin is warm and dry.     Capillary Refill: Capillary refill takes less than 2 seconds.  Neurological:     Mental Status: She is alert.  Psychiatric:        Mood and Affect: Mood normal.     UC Treatments / Results  Labs (all labs ordered are listed, but only abnormal results are displayed) Labs Reviewed  POC INFLUENZA A AND B ANTIGEN (URGENT CARE ONLY)    EKG   Radiology No results found.  Procedures Procedures (including critical care time)  Medications Ordered in UC Medications - No data to display  Initial Impression / Assessment and Plan / UC Course  I have reviewed the triage vital signs and the nursing notes.  Pertinent labs &  imaging results that were available during my  care of the patient were reviewed by me and considered in my medical decision making (see chart for details).     URI, flu negative.  Supportive treatment discussed. Vitals wnl, stable for discharge.  Return precautions discussed. Cough syrup prescribed.  Final Clinical Impressions(s) / UC Diagnoses   Final diagnoses:  Viral upper respiratory tract infection     Discharge Instructions      Take cough syrup as needed Recommend daily Zyrtec or Claritin, flonase, and mucinex Drink plenty of fluids, rest Return if symptoms become worse    ED Prescriptions     Medication Sig Dispense Auth. Provider   promethazine-dextromethorphan (PROMETHAZINE-DM) 6.25-15 MG/5ML syrup Take 5 mLs by mouth 4 (four) times daily as needed for cough. 118 mL Ward, Lenise Arena, PA-C      PDMP not reviewed this encounter.   Ward, Lenise Arena, PA-C 01/07/22 1757

## 2022-02-21 ENCOUNTER — Ambulatory Visit
Admission: EM | Admit: 2022-02-21 | Discharge: 2022-02-21 | Disposition: A | Discharge status: Home / Self Care | Payer: No Typology Code available for payment source | Attending: Emergency Medicine | Admitting: Emergency Medicine

## 2022-02-21 ENCOUNTER — Other Ambulatory Visit: Payer: Self-pay

## 2022-02-21 DIAGNOSIS — J029 Acute pharyngitis, unspecified: Secondary | ICD-10-CM | POA: Insufficient documentation

## 2022-02-21 DIAGNOSIS — J36 Peritonsillar abscess: Secondary | ICD-10-CM | POA: Diagnosis present

## 2022-02-21 DIAGNOSIS — R509 Fever, unspecified: Secondary | ICD-10-CM | POA: Diagnosis present

## 2022-02-21 DIAGNOSIS — H66001 Acute suppurative otitis media without spontaneous rupture of ear drum, right ear: Secondary | ICD-10-CM | POA: Insufficient documentation

## 2022-02-21 DIAGNOSIS — R590 Localized enlarged lymph nodes: Secondary | ICD-10-CM | POA: Diagnosis present

## 2022-02-21 DIAGNOSIS — J302 Other seasonal allergic rhinitis: Secondary | ICD-10-CM | POA: Insufficient documentation

## 2022-02-21 DIAGNOSIS — J3089 Other allergic rhinitis: Secondary | ICD-10-CM | POA: Insufficient documentation

## 2022-02-21 LAB — POCT RAPID STREP A (OFFICE): Rapid Strep A Screen: NEGATIVE

## 2022-02-21 MED ORDER — MOMETASONE FUROATE 50 MCG/ACT NA SUSP: 2.0000 | Freq: Every day | NASAL | 2 refills | Status: AC

## 2022-02-21 MED ORDER — CEFDINIR 300 MG PO CAPS
300.0000 mg | ORAL_CAPSULE | Freq: Two times a day (BID) | ORAL | 0 refills | Status: AC
Start: 2022-02-21 — End: 2022-03-03

## 2022-02-21 MED ORDER — LEVOCETIRIZINE DIHYDROCHLORIDE 5 MG PO TABS: 5.0000 mg | ORAL_TABLET | Freq: Every evening | ORAL | 2 refills | Status: AC

## 2022-02-21 NOTE — ED Triage Notes (Signed)
Pt c/o cough for a month, she reports having exposure to strep (friday had a fever,swelling to left sided lymph nodes).

## 2022-02-21 NOTE — ED Provider Notes (Signed)
[facility]    CSN: [id number] Arrival date & time: 02/21/22  1459    HISTORY   Chief Complaint  Patient presents with   Sore Throat   Cough   HPI Cheryl Roman is a 26 y.o. female. Pt c/o cough for a month, she reports having exposure to strep pharyngitis.  Patient states that 5 days ago, she had a fever, swelling of her left-sided lymph nodes.  EMR reviewed, patient was seen at urgent care on January 8 for a viral upper respiratory infection not due to COVID or influenza and was advised to use Robitussin cough syrup and second-generation antihistamine for her symptoms, patient states she is not taking either at this time.  Patient states that she saw another urgent care provider 2 weeks ago and was prescribed doxycycline for a left ear infection.  Patient states that she only started taking the doxycycline a few days ago due to history of penicillin allergies and not being able to pick it up for about a week.  Patient states she is actually never taken penicillin, states her mother is allergic to penicillin so she has never been prescribed this medication for that reason.  EMR reviewed, patient has been prescribed amoxicillin, ceftriaxone and cefdinir in the past without unwanted side effects.  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    The history is provided by the patient.  Past Medical History:  Diagnosis Date   Obesity    Overweight(278.02)    Tinea    Patient Active Problem List   Diagnosis Date Noted   Bronchitis 12/19/2017   Sore throat 04/13/2017   Acute non-recurrent sinusitis 04/05/2017   Left shoulder pain 08/03/2016   Family history of hyperlipidemia 09/29/2012   Obesity (BMI 30.0-34.9) 09/29/2012   Tonsillopharyngitis 03/10/2012   Past Surgical History:  Procedure Laterality Date   WISDOM TOOTH EXTRACTION  summer 2012   OB History   No obstetric history on file.    Home  Medications    Prior to Admission medications   Medication Sig Start Date End Date Taking? Authorizing Provider  albuterol (PROVENTIL HFA;VENTOLIN HFA) 108 (90 Base) MCG/ACT inhaler Inhale 1-2 puffs into the lungs every 6 (six) hours as needed for wheezing or shortness of breath. 12/19/17   Estelle June, NP  etonogestrel (NEXPLANON) 68 MG IMPL implant 1 each by Subdermal route once.    [provider]  lisdexamfetamine (VYVANSE) 20 MG capsule Take 1 capsule (20 mg total) by mouth daily with breakfast. 10/19/15 11/19/15  Klett, Pascal Lux, NP   Family History Family History  Problem Relation Age of Onset   Diabetes Father    Hyperlipidemia Father    Hypertension Father    Asthma Paternal Grandmother    Arthritis Paternal Grandmother    Diabetes Maternal Grandfather    Heart attack Neg Hx    Sudden death Neg Hx    Alcohol abuse Neg Hx    Birth defects Neg Hx    Cancer Neg Hx    COPD Neg Hx    Depression Neg Hx    Drug abuse Neg Hx    Early death Neg Hx    Hearing loss Neg Hx    Heart disease Neg Hx    Kidney disease Neg Hx    Learning disabilities Neg Hx    Mental illness Neg Hx    Mental retardation Neg Hx    Miscarriages /  Stillbirths Neg Hx    Stroke Neg Hx    Vision loss Neg Hx    Varicose Veins Neg Hx    Social History Social History   Tobacco Use   Smoking status: Passive Smoke Exposure - Never Smoker   Smokeless tobacco: Never  Substance Use Topics   Alcohol use: No   Drug use: No   Allergies   Tomato  Review of Systems Review of Systems Pertinent findings noted in history of present illness.   Physical Exam Triage Vital Signs ED Triage Vitals  Enc Vitals Group     BP 10/27/21 0827 (!) 147/82     Pulse Rate 10/27/21 0827 72     Resp 10/27/21 0827 18     Temp 10/27/21 0827 98.3 F (36.8 C)     Temp Source 10/27/21 0827 Oral     SpO2 10/27/21 0827 98 %     Weight --      Height --      Head Circumference --      Peak Flow --      Pain  Score 10/27/21 0826 5     Pain Loc --      Pain Edu? --      Excl. in GC? --   No data found.  Updated Vital Signs BP 107/70 (BP Location: Left Arm)    Pulse 85    Temp 98.7 F (37.1 C) (Oral)    Resp 20    SpO2 98%   Physical Exam Vitals and nursing note reviewed.  Constitutional:      General: She is not in acute distress.    Appearance: Normal appearance. She is well-developed. She is obese. She is not ill-appearing.  HENT:     Head: Normocephalic and atraumatic.     Salivary Glands: Right salivary gland is not diffusely enlarged or tender. Left salivary gland is not diffusely enlarged or tender.     Right Ear: Hearing, ear canal and external ear normal. No drainage. A middle ear effusion is present. There is no impacted cerumen. Tympanic membrane is erythematous and bulging (Suppurative fluid).     Left Ear: Hearing, tympanic membrane, ear canal and external ear normal. No drainage.  No middle ear effusion. There is no impacted cerumen. Tympanic membrane is not erythematous or bulging.     Nose: Mucosal edema, congestion and rhinorrhea present. No nasal deformity or septal deviation. Rhinorrhea is clear.     Right Turbinates: Enlarged and swollen. Not pale.     Left Turbinates: Enlarged and swollen. Not pale.     Right Sinus: No maxillary sinus tenderness or frontal sinus tenderness.     Left Sinus: No maxillary sinus tenderness or frontal sinus tenderness.      Comments: Nasal crease    Mouth/Throat:     Lips: Pink. No lesions.     Mouth: Mucous membranes are moist. No oral lesions.     Pharynx: Oropharynx is clear. Uvula midline. No pharyngeal swelling, oropharyngeal exudate, posterior oropharyngeal erythema or uvula swelling.     Tonsils: No tonsillar exudate. 2+ on the right. 4+ on the left.  Eyes:     General: Lids are normal.        Right eye: No discharge.        Left eye: No discharge.     Extraocular Movements: Extraocular movements intact.     Conjunctiva/sclera:  Conjunctivae normal.     Right eye: Right conjunctiva is not injected.  Left eye: Left conjunctiva is not injected.  Neck:     Trachea: Trachea and phonation normal.  Cardiovascular:     Rate and Rhythm: Normal rate and regular rhythm.     Pulses: Normal pulses.     Heart sounds: Normal heart sounds. No murmur heard.   No friction rub. No gallop.  Pulmonary:     Effort: Pulmonary effort is normal. No accessory muscle usage, prolonged expiration or respiratory distress.     Breath sounds: Normal breath sounds. No stridor, decreased air movement or transmitted upper airway sounds. No decreased breath sounds, wheezing, rhonchi or rales.  Chest:     Chest wall: No tenderness.  Musculoskeletal:        General: Normal range of motion.     Cervical back: Normal range of motion and neck supple. Normal range of motion.  Lymphadenopathy:     Cervical: Cervical adenopathy present.     Right cervical: Superficial cervical adenopathy and posterior cervical adenopathy present.     Left cervical: Superficial cervical adenopathy and posterior cervical adenopathy present.     Upper Body:     Right upper body: Supraclavicular adenopathy present.     Left upper body: Supraclavicular adenopathy present.  Skin:    General: Skin is warm and dry.     Findings: No erythema or rash.  Neurological:     General: No focal deficit present.     Mental Status: She is alert and oriented to person, place, and time.  Psychiatric:        Mood and Affect: Mood normal.        Behavior: Behavior normal.    Visual Acuity Right Eye Distance:   Left Eye Distance:   Bilateral Distance:    Right Eye Near:   Left Eye Near:    Bilateral Near:     [facility] Couse / Diagnostics / Procedures:    EKG  Radiology No results found.  Procedures Procedures (including critical care time)  UC Diagnoses / Final Clinical Impressions(s)   I have reviewed the triage vital signs and the nursing notes.  Pertinent labs &  imaging results that were available during my care of the patient were reviewed by me and considered in my medical decision making (see chart for details).   Final diagnoses:  Peritonsillar abscess  Fever, unspecified fever cause  Left cervical lymphadenopathy  Sore throat  Acute suppurative otitis media of right ear  Perennial allergic rhinitis with seasonal variation   Patient provided with a prescription for cefdinir twice daily for 10 days.  Patient advised to use allergy medications as she has been advised in the past at future, unnecessary respiratory infections.  ED Prescriptions     Medication Sig Dispense Auth. Provider   cefdinir (OMNICEF) 300 MG capsule Take 1 capsule (300 mg total) by mouth 2 (two) times daily for 10 days. 20 capsule Theadora Rama Scales, PA-C   levocetirizine (XYZAL) 5 MG tablet Take 1 tablet (5 mg total) by mouth every evening. 30 tablet Theadora Rama Scales, PA-C   mometasone (NASONEX) 50 MCG/ACT nasal spray Place 2 sprays into the nose daily. 1 each Theadora Rama Scales, PA-C      I have reviewed the PDMP during this encounter.  Pending results:  Labs Reviewed  POCT RAPID STREP A (OFFICE)    Medications Ordered in UC: Medications - No data to display  Disposition Upon Discharge:  Condition: stable for discharge home Home: take medications as prescribed; routine discharge instructions as  discussed; follow up as advised.  Patient presented with an acute illness with associated systemic symptoms and significant discomfort requiring urgent management. In my opinion, this is a condition that a prudent lay person (someone who possesses an average knowledge of health and medicine) may potentially expect to result in complications if not addressed urgently such as respiratory distress, impairment of bodily function or dysfunction of bodily organs.   Routine symptom specific, illness specific and/or disease specific instructions were discussed with  the patient and/or caregiver at length.   As such, the patient has been evaluated and assessed, work-up was performed and treatment was provided in alignment with urgent care protocols and evidence based medicine.  Patient/parent/caregiver has been advised that the patient may require follow up for further testing and treatment if the symptoms continue in spite of treatment, as clinically indicated and appropriate.  If the patient was tested for COVID-19, Influenza and/or RSV, then the patient/parent/guardian was advised to isolate at home pending the results of his/her diagnostic coronavirus test and potentially longer if theyre positive. I have also advised pt that if his/her COVID-19 test returns positive, it's recommended to self-isolate for at least 10 days after symptoms first appeared AND until fever-free for 24 hours without fever reducer AND other symptoms have improved or resolved. Discussed self-isolation recommendations as well as instructions for household member/close contacts as per the [facility] and [place] DHHS, and also gave patient the COVID packet with this information.  Patient/parent/caregiver has been advised to [facility] in 3-5 days if no better; to PCP or the Emergency Department if new signs and symptoms develop, or if the current signs or symptoms continue to change or worsen for further workup, evaluation and treatment as clinically indicated and appropriate  The patient will follow up with their current PCP if and as advised. If the patient does not currently have a PCP we will assist them in obtaining one.   The patient may need specialty follow up if the symptoms continue, in spite of conservative treatment and management, for further workup, evaluation, consultation and treatment as clinically indicated and appropriate.  Patient/parent/caregiver verbalized understanding and agreement of plan as discussed.  All questions were addressed during visit.  Please see  discharge instructions below for further details of plan.  Discharge Instructions:   Discharge Instructions      Your strep test today is negative.  Throat culture will be performed per our protocol.  The result of your throat culture will be posted to your MyChart once it is complete, this typically takes 3 to 5 days.  If there is a positive result, you will be contacted by phone.  No further treatment will be needed.   Your symptoms and my physical exam findings are concerning for exacerbation of your underlying allergies.  It is important that you are consistent with taking allergy medications exactly as prescribed.  Not doing so increases your risk of more frequent upper respiratory infections that may or may not require the use of antibiotics, serious exacerbations that require the use of oral steroids, loss of time at work and missed social opportunities.   Please see the list below for recommended medications, dosages and frequencies to provide relief of your current symptoms:     Cefdinir (Omnicef):  1 capsule twice daily for 10 days, you can take it with or without food to treat you for peritonsillar abscess and right infectious otitis media.  Cefdinir also effectively resolves streptococcal pharyngitis.  Cefdinir  is safe for you to take because you have been prescribed the same antibiotic in 2018 and you have also tolerated amoxicillin without any issues.  Cefdinir, like every antibiotic, can cause upset stomach.  This will resolve once antibiotics are complete.  You are welcome to use a probiotic, eat yogurt, take Imodium while taking this medication.  Please avoid other systemic medications such as Maalox, Pepto-Bismol or milk of magnesia as they can interfere with your body's ability to absorb the antibiotics.   Mometasone (Nasonex): This is a steroid nasal spray that you use once daily, 2 sprays in each nare.  After 3 to 5 days of use, you will have significant improvement of the  inflammation and mucus production that is being caused by exposure to allergens.  This medication can be purchased over-the-counter however I have provided you with a prescription.      Levocetirizine (Xyzal): This is an excellent second-generation antihistamine that helps to reduce respiratory inflammatory response to viruses and environmental allergens.  Please take 1 tablet daily at bedtime.   All of these prescriptions have been sent to your pharmacy.  Conservative care is also recommended at this time.  This includes rest, pushing clear fluids and activity as tolerated.  Warm beverages such as teas and broths versus cold beverages/popsicles and frozen sherbet/sorbet are your choice, both warm and cold are beneficial.  You may also notice that your appetite is reduced; this is okay as long as you are drinking plenty of clear fluids.    Please remain home from work, school, public places until you have been fever free for 24 hours without the use of antifever medications such as Tylenol or ibuprofen.    Thank you for visiting urgent care today.  We appreciate the opportunity to participate in your care.      This office note has been dictated using Teaching laboratory technician.  Unfortunately, and despite my best efforts, this method of dictation can sometimes lead to occasional typographical or grammatical errors.  I apologize in advance if this occurs.     Theadora Rama Scales, [place] 02/21/22 [phone]

## 2022-02-21 NOTE — Discharge Instructions (Addendum)
Your strep test today is negative.  Throat culture will be performed per our protocol.  The result of your throat culture will be posted to your MyChart once it is complete, this typically takes 3 to 5 days.  If there is a positive result, you will be contacted by phone.  No further treatment will be needed.   Your symptoms and my physical exam findings are concerning for exacerbation of your underlying allergies.  It is important that you are consistent with taking allergy medications exactly as prescribed.  Not doing so increases your risk of more frequent upper respiratory infections that may or may not require the use of antibiotics, serious exacerbations that require the use of oral steroids, loss of time at work and missed social opportunities.   Please see the list below for recommended medications, dosages and frequencies to provide relief of your current symptoms:     Cefdinir (Omnicef):  1 capsule twice daily for 10 days, you can take it with or without food to treat you for peritonsillar abscess and right infectious otitis media.  Cefdinir also effectively resolves streptococcal pharyngitis.  Cefdinir is safe for you to take because you have been prescribed the same antibiotic in 2018 and you have also tolerated amoxicillin without any issues.  Cefdinir, like every antibiotic, can cause upset stomach.  This will resolve once antibiotics are complete.  You are welcome to use a probiotic, eat yogurt, take Imodium while taking this medication.  Please avoid other systemic medications such as Maalox, Pepto-Bismol or milk of magnesia as they can interfere with your body's ability to absorb the antibiotics.   Mometasone (Nasonex): This is a steroid nasal spray that you use once daily, 2 sprays in each nare.  After 3 to 5 days of use, you will have significant improvement of the inflammation and mucus production that is being caused by exposure to allergens.  This medication can be purchased  over-the-counter however I have provided you with a prescription.      Levocetirizine (Xyzal): This is an excellent second-generation antihistamine that helps to reduce respiratory inflammatory response to viruses and environmental allergens.  Please take 1 tablet daily at bedtime.   All of these prescriptions have been sent to your pharmacy.  Conservative care is also recommended at this time.  This includes rest, pushing clear fluids and activity as tolerated.  Warm beverages such as teas and broths versus cold beverages/popsicles and frozen sherbet/sorbet are your choice, both warm and cold are beneficial.  You may also notice that your appetite is reduced; this is okay as long as you are drinking plenty of clear fluids.    Please remain home from work, school, public places until you have been fever free for 24 hours without the use of antifever medications such as Tylenol or ibuprofen.    Thank you for visiting urgent care today.  We appreciate the opportunity to participate in your care.

## 2022-02-24 LAB — CULTURE, GROUP A STREP (THRC)

## 2022-12-28 ENCOUNTER — Other Ambulatory Visit (HOSPITAL_COMMUNITY): Payer: Self-pay

## 2022-12-28 MED ORDER — WEGOVY 0.25 MG/0.5ML ~~LOC~~ SOAJ
0.5000 mL | SUBCUTANEOUS | 1 refills | Status: AC
  Filled 2022-12-28 – 2023-01-04 (×3): qty 2, 28d supply, fill #0

## 2023-01-01 ENCOUNTER — Other Ambulatory Visit (HOSPITAL_COMMUNITY): Payer: Self-pay

## 2023-01-04 ENCOUNTER — Other Ambulatory Visit (HOSPITAL_COMMUNITY): Payer: Self-pay

## 2023-10-30 DIAGNOSIS — B353 Tinea pedis: Secondary | ICD-10-CM | POA: Diagnosis not present

## 2023-10-30 DIAGNOSIS — J301 Allergic rhinitis due to pollen: Secondary | ICD-10-CM | POA: Diagnosis not present

## 2023-10-30 DIAGNOSIS — F900 Attention-deficit hyperactivity disorder, predominantly inattentive type: Secondary | ICD-10-CM | POA: Diagnosis not present

## 2023-11-21 ENCOUNTER — Ambulatory Visit (HOSPITAL_COMMUNITY)
Admission: EM | Admit: 2023-11-21 | Discharge: 2023-11-21 | Disposition: A | Discharge status: Home / Self Care | Payer: 59 | Attending: Internal Medicine | Admitting: Internal Medicine

## 2023-11-21 ENCOUNTER — Encounter (HOSPITAL_COMMUNITY): Payer: Self-pay

## 2023-11-21 DIAGNOSIS — J069 Acute upper respiratory infection, unspecified: Secondary | ICD-10-CM | POA: Diagnosis not present

## 2023-11-21 DIAGNOSIS — J029 Acute pharyngitis, unspecified: Secondary | ICD-10-CM | POA: Insufficient documentation

## 2023-11-21 DIAGNOSIS — B9789 Other viral agents as the cause of diseases classified elsewhere: Secondary | ICD-10-CM | POA: Insufficient documentation

## 2023-11-21 LAB — POCT RAPID STREP A (OFFICE): Rapid Strep A Screen: NEGATIVE

## 2023-11-21 MED ORDER — BENZONATATE 100 MG PO CAPS: 100.0000 mg | ORAL_CAPSULE | Freq: Three times a day (TID) | ORAL | 0 refills | Status: DC | PRN

## 2023-11-21 MED ORDER — PREDNISONE 20 MG PO TABS: 40.0000 mg | ORAL_TABLET | Freq: Every day | ORAL | 0 refills | Status: AC

## 2023-11-21 NOTE — ED Provider Notes (Signed)
[facility]    CSN: [id number] Arrival date & time: 11/21/23  1629      History   Chief Complaint Chief Complaint  Patient presents with   Sore Throat    HPI Cheryl Roman is a 27 y.o. female.   Patient presents with sore throat, headache, nasal congestion, decreased appetite, cough that started about 4 days ago.  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  She has been taking DayQuil, NyQuil, Mucinex, steroid nasal spray with minimal improvement in symptoms.  Denies history of asthma.  Denies chest pain or shortness of breath.  She took a COVID test at home that was negative.   Sore Throat    Past Medical History:  Diagnosis Date   Obesity    Overweight(278.02)    Tinea     Patient Active Problem List   Diagnosis Date Noted   Bronchitis 12/19/2017   Sore throat 04/13/2017   Acute non-recurrent sinusitis 04/05/2017   Left shoulder pain 08/03/2016   Family history of hyperlipidemia 09/29/2012   Obesity (BMI 30.0-34.9) 09/29/2012   Tonsillopharyngitis 03/10/2012    Past Surgical History:  Procedure Laterality Date   WISDOM TOOTH EXTRACTION  summer 2012    OB History   No obstetric history on file.      Home Medications    Prior to Admission medications   Medication Sig Start Date End Date Taking? Authorizing Provider  amphetamine-dextroamphetamine (ADDERALL XR) 15 MG 24 hr capsule Take 30 mg by mouth every morning. 11/01/23  Yes [provider]  benzonatate (TESSALON) 100 MG capsule Take 1 capsule (100 mg total) by mouth every 8 (eight) hours as needed for cough. 11/21/23  Yes Ethelean Colla, Acie Fredrickson, FNP  levocetirizine (XYZAL) 5 MG tablet Take 1 tablet (5 mg total) by mouth every evening. 02/21/22 11/21/23 Yes Theadora Rama Scales, PA-C  mometasone (NASONEX) 50 MCG/ACT nasal spray Place 2 sprays into the nose daily. 02/21/22  Yes Theadora Rama Scales, PA-C  predniSONE (DELTASONE) 20 MG  tablet Take 2 tablets (40 mg total) by mouth daily for 5 days. 11/21/23 11/26/23 Yes Eilam Shrews[place], Acie Fredrickson, FNP  albuterol (PROVENTIL HFA;VENTOLIN HFA) 108 (90 Base) MCG/ACT inhaler Inhale 1-2 puffs into the lungs every 6 (six) hours as needed for wheezing or shortness of breath. 12/19/17   Estelle June, NP  etonogestrel (NEXPLANON) 68 MG IMPL implant 1 each by Subdermal route once.    [provider]  Semaglutide-Weight Management (WEGOVY) 0.25 MG/0.5ML SOAJ Inject 0.25 mg into the skin once a week. 12/28/22       Family History Family History  Problem Relation Age of Onset   Diabetes Father    Hyperlipidemia Father    Hypertension Father    Asthma Paternal Grandmother    Arthritis Paternal Grandmother    Diabetes Maternal Grandfather    Heart attack Neg Hx    Sudden death Neg Hx    Alcohol abuse Neg Hx    Birth defects Neg Hx    Cancer Neg Hx    COPD Neg Hx    Depression Neg Hx    Drug abuse Neg Hx    Early death Neg Hx    Hearing loss Neg Hx    Heart disease Neg Hx    Kidney disease Neg Hx    Learning disabilities Neg Hx    Mental illness Neg Hx    Mental retardation Neg Hx    Miscarriages /  Stillbirths Neg Hx    Stroke Neg Hx    Vision loss Neg Hx    Varicose Veins Neg Hx     Social History Social History   Tobacco Use   Smoking status: Passive Smoke Exposure - Never Smoker   Smokeless tobacco: Never  Substance Use Topics   Alcohol use: No   Drug use: No     Allergies   Latex, Penicillins, and Tomato   Review of Systems Review of Systems Per HPI  Physical Exam Triage Vital Signs ED Triage Vitals  Encounter Vitals Group     BP 11/21/23 1656 115/80     Systolic BP Percentile --      Diastolic BP Percentile --      Pulse Rate 11/21/23 1656 100     Resp 11/21/23 1656 18     Temp 11/21/23 1656 98.2 F (36.8 C)     Temp Source 11/21/23 1656 Oral     SpO2 11/21/23 1656 95 %     Weight 11/21/23 1656 220 lb (99.8 kg)     Height 11/21/23 1656 4\' 9"   (1.448 m)     Head Circumference --      Peak Flow --      Pain Score 11/21/23 1652 5     Pain Loc --      Pain Education --      Exclude from Growth Chart --    No data found.  Updated Vital Signs BP 115/80 (BP Location: Left Arm)   Pulse 100   Temp 98.2 F (36.8 C) (Oral)   Resp 18   Ht 4\' 9"  (1.448 m)   Wt 220 lb (99.8 kg)   LMP  (LMP Unknown)   SpO2 95%   BMI 47.61 kg/m   Visual Acuity Right Eye Distance:   Left Eye Distance:   Bilateral Distance:    Right Eye Near:   Left Eye Near:    Bilateral Near:     Physical Exam Constitutional:      General: She is not in acute distress.    Appearance: Normal appearance. She is not toxic-appearing or diaphoretic.  HENT:     Head: Normocephalic and atraumatic.     Right Ear: Ear canal normal. No drainage, swelling or tenderness. A middle ear effusion is present. Tympanic membrane is not perforated, erythematous or bulging.     Left Ear: Ear canal normal. No drainage, swelling or tenderness. A middle ear effusion is present. Tympanic membrane is not perforated, erythematous or bulging.     Nose: Congestion present.     Mouth/Throat:     Mouth: Mucous membranes are moist.     Pharynx: Posterior oropharyngeal erythema present.  Eyes:     Extraocular Movements: Extraocular movements intact.     Conjunctiva/sclera: Conjunctivae normal.     Pupils: Pupils are equal, round, and reactive to light.  Cardiovascular:     Rate and Rhythm: Normal rate and regular rhythm.     Pulses: Normal pulses.     Heart sounds: Normal heart sounds.  Pulmonary:     Effort: Pulmonary effort is normal. No respiratory distress.     Breath sounds: Normal breath sounds. No wheezing.  Abdominal:     General: Abdomen is flat. Bowel sounds are normal.     Palpations: Abdomen is soft.  Musculoskeletal:        General: Normal range of motion.     Cervical back: Normal range of motion.  Skin:  General: Skin is warm and dry.  Neurological:      General: No focal deficit present.     Mental Status: She is alert and oriented to person, place, and time. Mental status is at baseline.  Psychiatric:        Mood and Affect: Mood normal.        Behavior: Behavior normal.      UC Treatments / Results  Labs (all labs ordered are listed, but only abnormal results are displayed) Labs Reviewed  CULTURE, GROUP A STREP [facility])  POCT RAPID STREP A (OFFICE)    EKG   Radiology No results found.  Procedures Procedures (including critical care time)  Medications Ordered in UC Medications - No data to display  Initial Impression / Assessment and Plan / UC Course  I have reviewed the triage vital signs and the nursing notes.  Pertinent labs & imaging results that were available during my care of the patient were reviewed by me and considered in my medical decision making (see chart for details).     Patient presents with symptoms likely from a viral upper respiratory infection.  Do not suspect underlying cardiopulmonary process. Symptoms seem unlikely related to ACS, CHF or COPD exacerbations, pneumonia, pneumothorax. Patient is nontoxic appearing and not in need of emergent medical intervention.  Rapid strep negative.  Throat culture pending.  COVID testing deferred given negative COVID test at home.  Recommended symptom control with medications, supportive care, fluids, rest.  Will treat symptoms and fluid behind TMs with prednisone steroid given that over-the-counter decongestants and Flonase have not been helpful.  Return if symptoms fail to improve in 1-2 weeks or you develop shortness of breath, chest pain, severe headache. Patient states understanding and is agreeable.  Discharged with PCP followup.  Final Clinical Impressions(s) / UC Diagnoses   Final diagnoses:  Viral upper respiratory tract infection with cough  Sore throat     Discharge Instructions      Rapid strep is negative.  Throat culture is pending.  We will  call if it is positive.  As we discussed, suspect viral cause to your symptoms.  I have prescribed prednisone steroid and a cough medication to help with your symptoms.  Please follow-up if any symptoms persist or worsen.     ED Prescriptions     Medication Sig Dispense Auth. Provider   predniSONE (DELTASONE) 20 MG tablet Take 2 tablets (40 mg total) by mouth daily for 5 days. 10 tablet [place], [place], [place]   benzonatate (TESSALON) 100 MG capsule Take 1 capsule (100 mg total) by mouth every 8 (eight) hours as needed for cough. 21 capsule [place], Acie Fredrickson, [place]      PDMP not reviewed this encounter.   Gustavus Bryant, [place] 11/21/23 [phone]

## 2023-11-21 NOTE — ED Triage Notes (Signed)
Sore Throat, low grade fever, headache, low appetite ongoing Monday. 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   Patient tried, Dayquil Nyquil, and Mucinex with little relief.

## 2023-11-21 NOTE — Discharge Instructions (Signed)
Rapid strep is negative.  Throat culture is pending.  We will call if it is positive.  As we discussed, suspect viral cause to your symptoms.  I have prescribed prednisone steroid and a cough medication to help with your symptoms.  Please follow-up if any symptoms persist or worsen.

## 2023-11-25 LAB — CULTURE, GROUP A STREP (THRC)

## 2023-12-09 ENCOUNTER — Encounter (HOSPITAL_COMMUNITY): Payer: Self-pay

## 2023-12-09 ENCOUNTER — Ambulatory Visit (HOSPITAL_COMMUNITY)
Admission: EM | Admit: 2023-12-09 | Discharge: 2023-12-09 | Disposition: A | Discharge status: Home / Self Care | Payer: 59 | Attending: Emergency Medicine | Admitting: Emergency Medicine

## 2023-12-09 ENCOUNTER — Ambulatory Visit (INDEPENDENT_AMBULATORY_CARE_PROVIDER_SITE_OTHER): Payer: 59

## 2023-12-09 DIAGNOSIS — R051 Acute cough: Secondary | ICD-10-CM

## 2023-12-09 DIAGNOSIS — R059 Cough, unspecified: Secondary | ICD-10-CM | POA: Diagnosis not present

## 2023-12-09 DIAGNOSIS — J40 Bronchitis, not specified as acute or chronic: Secondary | ICD-10-CM

## 2023-12-09 DIAGNOSIS — R062 Wheezing: Secondary | ICD-10-CM | POA: Diagnosis not present

## 2023-12-09 LAB — POC COVID19/FLU A&B COMBO
Covid Antigen, POC: NEGATIVE
Influenza A Antigen, POC: NEGATIVE
Influenza B Antigen, POC: NEGATIVE

## 2023-12-09 MED ORDER — PREDNISONE 10 MG (21) PO TBPK: ORAL_TABLET | Freq: Every day | ORAL | 0 refills | Status: AC

## 2023-12-09 MED ORDER — BENZONATATE 100 MG PO CAPS: 100.0000 mg | ORAL_CAPSULE | Freq: Three times a day (TID) | ORAL | 0 refills | Status: AC | PRN

## 2023-12-09 MED ORDER — ALBUTEROL SULFATE HFA 108 (90 BASE) MCG/ACT IN AERS: 1.0000 | INHALATION_SPRAY | Freq: Four times a day (QID) | RESPIRATORY_TRACT | 2 refills | Status: AC | PRN

## 2023-12-09 NOTE — ED Provider Notes (Signed)
[facility]    CSN: [id number] Arrival date & time: 12/09/23  0932      History   Chief Complaint Chief Complaint  Patient presents with   Cough    HPI Cheryl Roman is a 27 y.o. female.   Patient presents today with cough congestion wheezing headache fever over the weekend since 11/21.  Patient was seen here approximately 2 weeks ago and has no improvement.  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  Patient has been taking several medicines over-the-counter with no relief.  Denies any nausea vomiting diarrhea, not eating or drinking much.     Past Medical History:  Diagnosis Date   Obesity    Overweight(278.02)    Tinea     Patient Active Problem List   Diagnosis Date Noted   Bronchitis 12/19/2017   Sore throat 04/13/2017   Acute non-recurrent sinusitis 04/05/2017   Left shoulder pain 08/03/2016   Family history of hyperlipidemia 09/29/2012   Obesity (BMI 30.0-34.9) 09/29/2012   Tonsillopharyngitis 03/10/2012    Past Surgical History:  Procedure Laterality Date   WISDOM TOOTH EXTRACTION  summer 2012    OB History   No obstetric history on file.      Home Medications    Prior to Admission medications   Medication Sig Start Date End Date Taking? Authorizing Provider  albuterol (VENTOLIN HFA) 108 (90 Base) MCG/ACT inhaler Inhale 1-2 puffs into the lungs every 6 (six) hours as needed for wheezing or shortness of breath. 12/09/23   Coralyn Mark, NP  amphetamine-dextroamphetamine (ADDERALL XR) 15 MG 24 hr capsule Take 30 mg by mouth every morning. 11/01/23   [provider]  benzonatate (TESSALON) 100 MG capsule Take 1 capsule (100 mg total) by mouth every 8 (eight) hours as needed for cough. 12/09/23   Coralyn Mark, NP  etonogestrel (NEXPLANON) 68 MG IMPL implant 1 each by Subdermal route once.    [provider]  levocetirizine (XYZAL) 5 MG tablet Take 1  tablet (5 mg total) by mouth every evening. 02/21/22 11/21/23  Theadora Rama Scales, PA-C  mometasone (NASONEX) 50 MCG/ACT nasal spray Place 2 sprays into the nose daily. 02/21/22   Theadora Rama Scales, PA-C  predniSONE (STERAPRED UNI-PAK 21 TAB) 10 MG (21) TBPK tablet Take by mouth daily. Take 6 tabs by mouth daily  for 2 days, then 5 tabs for 2 days, then 4 tabs for 2 days, then 3 tabs for 2 days, 2 tabs for 2 days, then 1 tab by mouth daily for 2 days 12/09/23  Yes Coralyn Mark, NP  Semaglutide-Weight Management (WEGOVY) 0.25 MG/0.5ML SOAJ Inject 0.25 mg into the skin once a week. 12/28/22       Family History Family History  Problem Relation Age of Onset   Diabetes Father    Hyperlipidemia Father    Hypertension Father    Asthma Paternal Grandmother    Arthritis Paternal Grandmother    Diabetes Maternal Grandfather    Heart attack Neg Hx    Sudden death Neg Hx    Alcohol abuse Neg Hx    Birth defects Neg Hx    Cancer Neg Hx    COPD Neg Hx    Depression Neg Hx    Drug abuse Neg Hx    Early death Neg Hx    Hearing loss Neg Hx    Heart disease Neg Hx    Kidney disease Neg Hx  Learning disabilities Neg Hx    Mental illness Neg Hx    Mental retardation Neg Hx    Miscarriages / Stillbirths Neg Hx    Stroke Neg Hx    Vision loss Neg Hx    Varicose Veins Neg Hx     Social History Social History   Tobacco Use   Smoking status: Passive Smoke Exposure - Never Smoker   Smokeless tobacco: Never  Vaping Use   Vaping status: Never Used  Substance Use Topics   Alcohol use: No   Drug use: No     Allergies   Latex, Penicillins, and Tomato   Review of Systems Review of Systems  Constitutional:  Positive for appetite change, fatigue and fever.  HENT:  Positive for congestion, postnasal drip and sinus pain. Negative for ear pain.   Eyes: Negative.   Respiratory:  Positive for cough, shortness of breath and wheezing.   Cardiovascular: Negative.    Gastrointestinal: Negative.   Genitourinary: Negative.   Musculoskeletal: Negative.   Skin: Negative.   Neurological: Negative.      Physical Exam Triage Vital Signs ED Triage Vitals  Encounter Vitals Group     BP 12/09/23 1013 127/87     Systolic BP Percentile --      Diastolic BP Percentile --      Pulse Rate 12/09/23 1013 87     Resp 12/09/23 1013 20     Temp 12/09/23 1013 (!) 97.5 F (36.4 C)     Temp Source 12/09/23 1013 Oral     SpO2 12/09/23 1013 95 %     Weight 12/09/23 1011 220 lb (99.8 kg)     Height 12/09/23 1011 4\' 9"  (1.448 m)     Head Circumference --      Peak Flow --      Pain Score 12/09/23 1010 4     Pain Loc --      Pain Education --      Exclude from Growth Chart --    No data found.  Updated Vital Signs BP 127/87 (BP Location: Left Arm)   Pulse 87   Temp (!) 97.5 F (36.4 C) (Oral)   Resp 20   Ht 4\' 9"  (1.448 m)   Wt 220 lb (99.8 kg)   LMP 12/09/2023 (Exact Date)   SpO2 95%   BMI 47.61 kg/m   Visual Acuity Right Eye Distance:   Left Eye Distance:   Bilateral Distance:    Right Eye Near:   Left Eye Near:    Bilateral Near:     Physical Exam Constitutional:      Appearance: Normal appearance.  HENT:     Right Ear: Tympanic membrane normal.     Left Ear: Tympanic membrane normal.     Nose: Congestion present.     Mouth/Throat:     Mouth: Mucous membranes are moist.  Eyes:     Pupils: Pupils are equal, round, and reactive to light.  Cardiovascular:     Rate and Rhythm: Normal rate.  Pulmonary:     Effort: Pulmonary effort is normal.     Breath sounds: Normal breath sounds.  Abdominal:     General: Abdomen is flat. Bowel sounds are normal.  Musculoskeletal:        General: Normal range of motion.  Skin:    General: Skin is warm.  Neurological:     General: No focal deficit present.     Mental Status: She is alert.  UC Treatments / Results  Labs (all labs ordered are listed, but only abnormal results are  displayed) Labs Reviewed  POC COVID19/FLU A&B COMBO    EKG   Radiology DG Chest 2 View  Result Date: 12/09/2023 CLINICAL DATA:  Cough with illness for 2 weeks.  Wheezing. EXAM: CHEST - 2 VIEW COMPARISON:  12/19/2017 FINDINGS: Central airway cuffing on the lateral view. The hila are prominent on the lateral view but not changed or convincing for adenopathy when correlated with frontal image. No collapse or consolidation. No edema, effusion, or pneumothorax. Heart size and aortic contours are unremarkable. IMPRESSION: Airway thickening without focal pneumonia. Electronically Signed   By: Tiburcio Pea M.D.   On: 12/09/2023 11:02    Procedures Procedures (including critical care time)  Medications Ordered in UC Medications - No data to display  Initial Impression / Assessment and Plan / UC Course  I have reviewed the triage vital signs and the nursing notes.  Pertinent labs & imaging results that were available during my care of the patient were reviewed by me and considered in my medical decision making (see chart for details).     Stay hydrated drink plenty of fluids Use a humidifier as needed Use inhaler as needed for shortness of breath or cough Coughing can persist for several weeks continue to take the cough medicine as needed  Chest x-ray is negative for pneumonia COVID and flu test is also negative Symptoms are viral in nature   Final Clinical Impressions(s) / UC Diagnoses   Final diagnoses:  Acute cough  Bronchitis     Discharge Instructions      Stay hydrated drink plenty of fluids Use a humidifier as needed Use inhaler as needed for shortness of breath or cough Coughing can persist for several weeks continue to take the cough medicine as needed  Chest x-ray is negative for pneumonia COVID and flu test is also negative Symptoms are viral in nature      ED Prescriptions     Medication Sig Dispense Auth. Provider   benzonatate (TESSALON) 100 MG  capsule Take 1 capsule (100 mg total) by mouth every 8 (eight) hours as needed for cough. 21 capsule Maple Mirza L, NP   predniSONE (STERAPRED UNI-PAK 21 TAB) 10 MG (21) TBPK tablet Take by mouth daily. Take 6 tabs by mouth daily  for 2 days, then 5 tabs for 2 days, then 4 tabs for 2 days, then 3 tabs for 2 days, 2 tabs for 2 days, then 1 tab by mouth daily for 2 days 42 tablet Maple Mirza L, NP   albuterol (VENTOLIN HFA) 108 (90 Base) MCG/ACT inhaler Inhale 1-2 puffs into the lungs every 6 (six) hours as needed for wheezing or shortness of breath. 1 each Coralyn Mark, NP      PDMP not reviewed this encounter.   Coralyn Mark, NP 12/09/23 [phone]

## 2023-12-09 NOTE — Discharge Instructions (Addendum)
Stay hydrated drink plenty of fluids Use a humidifier as needed Use inhaler as needed for shortness of breath or cough Coughing can persist for several weeks continue to take the cough medicine as needed  Chest x-ray is negative for pneumonia COVID and flu test is also negative Symptoms are viral in nature

## 2023-12-09 NOTE — ED Triage Notes (Addendum)
Pt presents with cough, headache, and nasal congestion x 2 weeks. Pt states she did have a fever yesterday and on Saturday 12/7. Pt states she is taking the prescribed medicines from her last visit on 11/21 with little to no improvement. Pt is also taking OTC medicines such as Nyquil and Dayquil. Pt currently rates her overall pain a 4/10. 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 Pt did an at home COVID test last night, it was negative.

## 2024-02-04 DIAGNOSIS — Z01419 Encounter for gynecological examination (general) (routine) without abnormal findings: Secondary | ICD-10-CM | POA: Diagnosis not present

## 2024-02-04 DIAGNOSIS — Z113 Encounter for screening for infections with a predominantly sexual mode of transmission: Secondary | ICD-10-CM | POA: Diagnosis not present

## 2024-03-27 ENCOUNTER — Other Ambulatory Visit (HOSPITAL_COMMUNITY): Payer: Self-pay

## 2024-03-27 MED ORDER — LEVOCETIRIZINE DIHYDROCHLORIDE 5 MG PO TABS
5.0000 mg | ORAL_TABLET | Freq: Every evening | ORAL | 1 refills | Status: AC
  Filled 2024-03-27: qty 30, 30d supply, fill #0

## 2024-04-03 ENCOUNTER — Other Ambulatory Visit (HOSPITAL_COMMUNITY): Payer: Self-pay

## 2024-04-03 DIAGNOSIS — L509 Urticaria, unspecified: Secondary | ICD-10-CM | POA: Diagnosis not present

## 2024-04-03 DIAGNOSIS — J301 Allergic rhinitis due to pollen: Secondary | ICD-10-CM | POA: Diagnosis not present

## 2024-04-03 DIAGNOSIS — R7303 Prediabetes: Secondary | ICD-10-CM | POA: Diagnosis not present

## 2024-04-03 DIAGNOSIS — Z1329 Encounter for screening for other suspected endocrine disorder: Secondary | ICD-10-CM | POA: Diagnosis not present

## 2024-04-03 DIAGNOSIS — F900 Attention-deficit hyperactivity disorder, predominantly inattentive type: Secondary | ICD-10-CM | POA: Diagnosis not present

## 2024-04-03 DIAGNOSIS — Z79899 Other long term (current) drug therapy: Secondary | ICD-10-CM | POA: Diagnosis not present

## 2024-04-03 DIAGNOSIS — Z13220 Encounter for screening for lipoid disorders: Secondary | ICD-10-CM | POA: Diagnosis not present

## 2024-04-03 DIAGNOSIS — Z130 Encounter for screening for diseases of the blood and blood-forming organs and certain disorders involving the immune mechanism: Secondary | ICD-10-CM | POA: Diagnosis not present

## 2024-04-03 DIAGNOSIS — Z1331 Encounter for screening for depression: Secondary | ICD-10-CM | POA: Diagnosis not present

## 2024-04-03 DIAGNOSIS — H6993 Unspecified Eustachian tube disorder, bilateral: Secondary | ICD-10-CM | POA: Diagnosis not present

## 2024-04-03 DIAGNOSIS — Z889 Allergy status to unspecified drugs, medicaments and biological substances status: Secondary | ICD-10-CM | POA: Diagnosis not present

## 2024-04-03 MED ORDER — METHYLPREDNISOLONE 4 MG PO TBPK
4.0000 mg | ORAL_TABLET | ORAL | 0 refills | Status: AC
  Filled 2024-04-03: qty 21, 6d supply, fill #0

## 2024-04-03 MED ORDER — EPINEPHRINE 0.3 MG/0.3ML IJ SOAJ
0.3000 mg | Freq: Once | INTRAMUSCULAR | 5 refills | Status: AC | PRN
  Filled 2024-04-03: qty 2, 2d supply, fill #0

## 2024-04-03 MED ORDER — AMPHETAMINE-DEXTROAMPHET ER 30 MG PO CP24
30.0000 mg | ORAL_CAPSULE | Freq: Every morning | ORAL | 0 refills | Status: DC
  Filled 2024-04-03: qty 30, 30d supply, fill #0

## 2024-04-03 MED ORDER — FLUTICASONE PROPIONATE 50 MCG/ACT NA SUSP
1.0000 | Freq: Two times a day (BID) | NASAL | 1 refills | Status: AC
  Filled 2024-04-03: qty 16, 30d supply, fill #0

## 2024-04-03 MED ORDER — FAMOTIDINE 20 MG PO TABS
20.0000 mg | ORAL_TABLET | Freq: Two times a day (BID) | ORAL | 1 refills | Status: AC
  Filled 2024-04-03: qty 60, 30d supply, fill #0

## 2024-04-06 ENCOUNTER — Other Ambulatory Visit (HOSPITAL_COMMUNITY): Payer: Self-pay

## 2024-04-08 ENCOUNTER — Other Ambulatory Visit: Payer: Self-pay

## 2024-04-16 ENCOUNTER — Other Ambulatory Visit (HOSPITAL_COMMUNITY): Payer: Self-pay

## 2024-04-30 ENCOUNTER — Other Ambulatory Visit (HOSPITAL_COMMUNITY): Payer: Self-pay

## 2024-04-30 MED ORDER — WEGOVY 0.25 MG/0.5ML ~~LOC~~ SOAJ
0.2500 mg | SUBCUTANEOUS | 0 refills | Status: AC
Start: 2024-04-30 — End: ?
  Filled 2024-04-30 – 2024-05-12 (×3): qty 2, 28d supply, fill #0

## 2024-04-30 MED ORDER — AMPHETAMINE-DEXTROAMPHET ER 30 MG PO CP24
30.0000 mg | ORAL_CAPSULE | Freq: Every morning | ORAL | 0 refills | Status: DC
  Filled 2024-04-30 – 2024-05-01 (×2): qty 30, 30d supply, fill #0

## 2024-04-30 MED ORDER — CLOTRIMAZOLE-BETAMETHASONE 1-0.05 % EX LOTN
1.0000 | TOPICAL_LOTION | Freq: Two times a day (BID) | CUTANEOUS | 3 refills | Status: AC
  Filled 2024-04-30 – 2024-05-12 (×2): qty 30, 15d supply, fill #0

## 2024-05-01 ENCOUNTER — Other Ambulatory Visit (HOSPITAL_COMMUNITY): Payer: Self-pay

## 2024-05-06 ENCOUNTER — Other Ambulatory Visit (HOSPITAL_COMMUNITY): Payer: Self-pay

## 2024-05-12 ENCOUNTER — Other Ambulatory Visit (HOSPITAL_COMMUNITY): Payer: Self-pay

## 2024-05-21 ENCOUNTER — Other Ambulatory Visit (HOSPITAL_COMMUNITY): Payer: Self-pay

## 2024-06-17 ENCOUNTER — Other Ambulatory Visit (HOSPITAL_COMMUNITY): Payer: Self-pay

## 2024-06-18 ENCOUNTER — Other Ambulatory Visit (HOSPITAL_COMMUNITY): Payer: Self-pay

## 2024-06-18 MED ORDER — AMPHETAMINE-DEXTROAMPHET ER 30 MG PO CP24
30.0000 mg | ORAL_CAPSULE | Freq: Every morning | ORAL | 0 refills | Status: DC
  Filled 2024-06-18: qty 30, 30d supply, fill #0

## 2024-07-10 ENCOUNTER — Other Ambulatory Visit (HOSPITAL_COMMUNITY): Payer: Self-pay

## 2024-07-10 MED ORDER — CLOTRIMAZOLE-BETAMETHASONE 1-0.05 % EX LOTN
1.0000 | TOPICAL_LOTION | Freq: Two times a day (BID) | CUTANEOUS | 3 refills | Status: AC
  Filled 2024-07-10: qty 30, 15d supply, fill #0

## 2024-07-10 MED ORDER — WEGOVY 0.25 MG/0.5ML ~~LOC~~ SOAJ
0.2500 mg | SUBCUTANEOUS | 0 refills | Status: AC
  Filled 2024-07-10: qty 2, 28d supply, fill #0

## 2024-07-20 ENCOUNTER — Other Ambulatory Visit (HOSPITAL_COMMUNITY): Payer: Self-pay

## 2024-07-21 ENCOUNTER — Other Ambulatory Visit (HOSPITAL_COMMUNITY): Payer: Self-pay

## 2024-07-22 ENCOUNTER — Other Ambulatory Visit (HOSPITAL_COMMUNITY): Payer: Self-pay

## 2024-07-22 MED ORDER — AMPHETAMINE-DEXTROAMPHET ER 30 MG PO CP24
30.0000 mg | ORAL_CAPSULE | Freq: Every morning | ORAL | 0 refills | Status: DC
  Filled 2024-07-22: qty 30, 30d supply, fill #0

## 2024-07-29 DIAGNOSIS — R0981 Nasal congestion: Secondary | ICD-10-CM | POA: Diagnosis not present

## 2024-07-29 DIAGNOSIS — R051 Acute cough: Secondary | ICD-10-CM | POA: Diagnosis not present

## 2024-07-29 DIAGNOSIS — J029 Acute pharyngitis, unspecified: Secondary | ICD-10-CM | POA: Diagnosis not present

## 2024-08-07 ENCOUNTER — Other Ambulatory Visit (HOSPITAL_COMMUNITY): Payer: Self-pay

## 2024-08-07 MED ORDER — WEGOVY 0.25 MG/0.5ML ~~LOC~~ SOAJ
0.2500 mg | SUBCUTANEOUS | 0 refills | Status: AC
  Filled 2024-08-07: qty 2, 28d supply, fill #0

## 2024-08-17 ENCOUNTER — Other Ambulatory Visit (HOSPITAL_COMMUNITY): Payer: Self-pay

## 2024-08-24 ENCOUNTER — Other Ambulatory Visit (HOSPITAL_COMMUNITY): Payer: Self-pay

## 2024-08-25 ENCOUNTER — Other Ambulatory Visit (HOSPITAL_COMMUNITY): Payer: Self-pay

## 2024-08-26 ENCOUNTER — Other Ambulatory Visit (HOSPITAL_COMMUNITY): Payer: Self-pay

## 2024-08-26 MED ORDER — AMPHETAMINE-DEXTROAMPHET ER 30 MG PO CP24
30.0000 mg | ORAL_CAPSULE | Freq: Every morning | ORAL | 0 refills | Status: DC
  Filled 2024-08-26: qty 30, 30d supply, fill #0

## 2024-09-17 ENCOUNTER — Other Ambulatory Visit (HOSPITAL_COMMUNITY): Payer: Self-pay

## 2024-09-17 MED ORDER — WEGOVY 0.5 MG/0.5ML ~~LOC~~ SOAJ
0.5000 mg | SUBCUTANEOUS | 2 refills | Status: DC
  Filled 2024-09-17: qty 2, 28d supply, fill #0

## 2024-09-28 ENCOUNTER — Other Ambulatory Visit (HOSPITAL_COMMUNITY): Payer: Self-pay

## 2024-09-28 MED ORDER — AMPHETAMINE-DEXTROAMPHET ER 30 MG PO CP24
30.0000 mg | ORAL_CAPSULE | Freq: Every morning | ORAL | 0 refills | Status: DC
Start: 2024-09-28 — End: 2024-10-29
  Filled 2024-09-28: qty 30, 30d supply, fill #0

## 2024-10-26 ENCOUNTER — Other Ambulatory Visit (HOSPITAL_COMMUNITY): Payer: Self-pay

## 2024-10-29 ENCOUNTER — Other Ambulatory Visit (HOSPITAL_COMMUNITY): Payer: Self-pay

## 2024-10-29 MED ORDER — AMPHETAMINE-DEXTROAMPHET ER 30 MG PO CP24
30.0000 mg | ORAL_CAPSULE | Freq: Every morning | ORAL | 0 refills | Status: DC
  Filled 2024-10-29: qty 30, 30d supply, fill #0

## 2024-11-02 DIAGNOSIS — J069 Acute upper respiratory infection, unspecified: Secondary | ICD-10-CM | POA: Diagnosis not present

## 2024-11-07 DIAGNOSIS — J019 Acute sinusitis, unspecified: Secondary | ICD-10-CM | POA: Diagnosis not present

## 2024-11-07 DIAGNOSIS — J209 Acute bronchitis, unspecified: Secondary | ICD-10-CM | POA: Diagnosis not present

## 2024-11-07 DIAGNOSIS — R051 Acute cough: Secondary | ICD-10-CM | POA: Diagnosis not present

## 2024-11-12 ENCOUNTER — Other Ambulatory Visit (HOSPITAL_COMMUNITY): Payer: Self-pay

## 2024-11-12 MED ORDER — WEGOVY 1 MG/0.5ML ~~LOC~~ SOAJ
1.0000 mg | SUBCUTANEOUS | 2 refills | Status: AC
  Filled 2024-11-12: qty 2, 28d supply, fill #0

## 2024-11-24 ENCOUNTER — Other Ambulatory Visit (HOSPITAL_COMMUNITY): Payer: Self-pay

## 2024-12-07 ENCOUNTER — Other Ambulatory Visit (HOSPITAL_COMMUNITY): Payer: Self-pay

## 2024-12-16 ENCOUNTER — Other Ambulatory Visit (HOSPITAL_COMMUNITY): Payer: Self-pay

## 2024-12-16 MED ORDER — AMPHETAMINE-DEXTROAMPHET ER 30 MG PO CP24
30.0000 mg | ORAL_CAPSULE | Freq: Every morning | ORAL | 0 refills | Status: DC
  Filled 2024-12-16: qty 30, 30d supply, fill #0

## 2025-02-02 ENCOUNTER — Encounter: Payer: Self-pay | Admitting: Pharmacist

## 2025-02-02 ENCOUNTER — Other Ambulatory Visit (HOSPITAL_COMMUNITY): Payer: Self-pay

## 2025-02-02 MED ORDER — AMPHETAMINE-DEXTROAMPHET ER 30 MG PO CP24
30.0000 mg | ORAL_CAPSULE | Freq: Every morning | ORAL | 0 refills | Status: AC
  Filled 2025-02-02: qty 30, 30d supply, fill #0

## 2025-02-03 ENCOUNTER — Other Ambulatory Visit (HOSPITAL_COMMUNITY): Payer: Self-pay

## 2025-02-04 ENCOUNTER — Other Ambulatory Visit (HOSPITAL_COMMUNITY): Payer: Self-pay
